# Patient Record
Sex: Female | Born: 1965 | Race: White | Hispanic: No | Marital: Single | Smoking: Never smoker
Health system: Southern US, Community
[De-identification: ages and names within clinical notes are randomized; demographics above are authoritative.]

## PROBLEM LIST (undated history)

## (undated) DIAGNOSIS — J302 Other seasonal allergic rhinitis: Secondary | ICD-10-CM

## (undated) DIAGNOSIS — M797 Fibromyalgia: Secondary | ICD-10-CM

## (undated) DIAGNOSIS — I10 Essential (primary) hypertension: Secondary | ICD-10-CM

## (undated) DIAGNOSIS — E119 Type 2 diabetes mellitus without complications: Secondary | ICD-10-CM

## (undated) HISTORY — PX: PALATE / UVULA BIOPSY / EXCISION: SUR128

## (undated) HISTORY — PX: CHOLECYSTECTOMY: SHX55

## (undated) HISTORY — DX: Other seasonal allergic rhinitis: J30.2

## (undated) HISTORY — PX: COLON SURGERY: SHX602

## (undated) HISTORY — PX: SINUS SURGERY WITH INSTATRAK: SHX5215

---

## 2009-06-04 ENCOUNTER — Encounter (INDEPENDENT_AMBULATORY_CARE_PROVIDER_SITE_OTHER): Payer: Self-pay | Admitting: Surgery

## 2009-06-04 ENCOUNTER — Inpatient Hospital Stay (HOSPITAL_COMMUNITY): Admission: RE | Admit: 2009-06-04 | Discharge: 2009-06-08 | Payer: Self-pay | Admitting: Surgery

## 2010-09-08 ENCOUNTER — Ambulatory Visit
Admission: RE | Admit: 2010-09-08 | Discharge: 2010-09-08 | Payer: Self-pay | Source: Home / Self Care | Admitting: Family Medicine

## 2010-10-10 NOTE — Assessment & Plan Note (Signed)
Summary: SINUS INF.,COUGH/WSE room 5   Vital Signs:  Patient Profile:   45 Years Old Female CC:      sinus problems Height:     64.5 inches Weight:      239.25 pounds O2 Sat:      98 % O2 treatment:    Room Air Temp:     99.0 degrees F oral Pulse rate:   90 / minute Resp:     18 per minute BP sitting:   131 / 84  (left arm) Cuff size:   large  Vitals Entered By: Clemens Catholic LPN (September 08, 2010 12:34 PM)                  Updated Prior Medication List: GILDESS FE 1.5/30 1.5-30 MG-MCG TABS (NORETHIN ACE-ETH ESTRAD-FE)  CEPHALEXIN 500 MG TABS (CEPHALEXIN)  ACTIFED COLD/ALLERGY 4-10 MG TABS (CHLORPHENIRAMINE-PHENYLEPHRINE)  PREDNISONE 10 MG TABS (PREDNISONE)   Current Allergies: ! PENICILLIN ! SEPTRAHistory of Present Illness Chief Complaint: sinus problems History of Present Illness:  Subjective: Patient complains of URI symptoms for 3 days.  She has a history of frequent sinusitis. + scratchy throat + cough started last night No pleuritic pain No wheezing + nasal congestion + post-nasal drainage + sinus pain/pressure No itchy/red eyes No earache No hemoptysis No SOB No fever/chills No nausea No vomiting No abdominal pain No diarrhea No skin rashes + fatigue No myalgias No headache Used OTC meds without relief   REVIEW OF SYSTEMS Constitutional Symptoms      Denies fever, chills, night sweats, weight loss, weight gain, and fatigue.  Eyes       Denies change in vision, eye pain, eye discharge, glasses, contact lenses, and eye surgery. Ear/Nose/Throat/Mouth       Complains of sinus problems.      Denies hearing loss/aids, change in hearing, ear pain, ear discharge, dizziness, frequent runny nose, frequent nose bleeds, sore throat, hoarseness, and tooth pain or bleeding.  Respiratory       Complains of dry cough.      Denies productive cough, wheezing, shortness of breath, asthma, bronchitis, and emphysema/COPD.  Cardiovascular       Denies  murmurs, chest pain, and tires easily with exhertion.    Gastrointestinal       Denies stomach pain, nausea/vomiting, diarrhea, constipation, blood in bowel movements, and indigestion. Genitourniary       Denies painful urination, kidney stones, and loss of urinary control. Neurological       Denies paralysis, seizures, and fainting/blackouts. Musculoskeletal       Denies muscle pain, joint pain, joint stiffness, decreased range of motion, redness, swelling, muscle weakness, and gout.  Skin       Denies bruising, unusual mles/lumps or sores, and hair/skin or nail changes.  Psych       Denies mood changes, temper/anger issues, anxiety/stress, speech problems, depression, and sleep problems. Other Comments: pt c/o sinus pressure, cough, watery eyes, green drainage x thursday. she started taking an old abt and prednisone rx that she had still no better.   Past History:  Past Medical History: fibromyalgia  Past Surgical History: sinus surgery 95 colon surgery 2010 Cholecystectomy  Family History: scleroderma, colon CA, heart dz, diabetes  Social History: Never Smoked Alcohol use-no Drug use-no Smoking Status:  never Drug Use:  no   Objective:  No acute distress  Eyes:  Pupils are equal, round, and reactive to light and accomdation.  Extraocular movement is intact.  Conjunctivae are not  inflamed.  Ears:  Canals normal.  Tympanic membranes normal but there is a serous effusion behind the left. Nose:  Normal septum.  Normal turbinates, mildly congested.   Mild maxillary sinus tenderness present.  Pharynx:  Normal  Neck:  Supple.  Slightly tender shotty posterior nodes are palpated bilaterally.  Lungs:  Clear to auscultation.  Breath sounds are equal.  Heart:  Regular rate and rhythm without murmurs, rubs, or gallops.  Abdomen:  Nontender without masses or hepatosplenomegaly.  Bowel sounds are present.  No CVA or flank tenderness.  Extremities:  No edema.   Skin:  No  rash Assessment New Problems: OTITIS MEDIA, SEROUS, ACUTE, LEFT (ICD-381.01) UPPER RESPIRATORY INFECTION, ACUTE (ICD-465.9)  SUSPECT VIRAL URI WITH EARLY SINUSITIS  Plan New Medications/Changes: BENZONATATE 200 MG CAPS (BENZONATATE) One by mouth hs as needed cough  #12 x 0, 09/08/2010, Donna Christen MD PREDNISONE 10 MG TABS (PREDNISONE) 2 PO BID for 2 days, then 1 BID for 2 days, then 1 daily for 2 days.  Take PC  #14 x 0, 09/08/2010, Donna Christen MD CLARITHROMYCIN 500 MG TABS (CLARITHROMYCIN) One Tab by mouth two times a day  #20 x 0, 09/08/2010, Donna Christen MD  New Orders: Pulse Oximetry (single measurment) [phone] New Patient Level III [99203] Planning Comments:   Begin Biaxin, tapering course of prednisone,  expectorant/decongestant, cough suppressant at bedtime.  Increase fluid intake Followup with PCP if not improving 10 to 14 days.   The patient and/or caregiver has been counseled thoroughly with regard to medications prescribed including dosage, schedule, interactions, rationale for use, and possible side effects and they verbalize understanding.  Diagnoses and expected course of recovery discussed and will return if not improved as expected or if the condition worsens. Patient and/or caregiver verbalized understanding.  Prescriptions: BENZONATATE 200 MG CAPS (BENZONATATE) One by mouth hs as needed cough  #12 x 0   Entered and Authorized by:   Donna Christen MD   Signed by:   Donna Christen MD on 09/08/2010   Method used:   Print then Give to Patient   RxID:   [id number] PREDNISONE 10 MG TABS (PREDNISONE) 2 PO BID for 2 days, then 1 BID for 2 days, then 1 daily for 2 days.  Take PC  #14 x 0   Entered and Authorized by:   Donna Christen MD   Signed by:   Donna Christen MD on 09/08/2010   Method used:   Print then Give to Patient   RxID:   [id number] CLARITHROMYCIN 500 MG TABS (CLARITHROMYCIN) One Tab by mouth two times a day  #20 x 0   Entered and Authorized by:    Donna Christen MD   Signed by:   Donna Christen MD on 09/08/2010   Method used:   Print then Give to Patient   RxID:   [id number]   Patient Instructions: 1)  Take Mucinex D (guaifenesin with decongestant) twice daily for congestion. 2)  Increase fluid intake, rest. 3)  May use Afrin nasal spray (or generic oxymetazoline) twice daily for about 5 days.  Also recommend using saline nasal spray several times daily and/or saline nasal irrigation. 4)  Followup with family doctor if not improving 10 to 14 days.  Orders Added: 1)  Pulse Oximetry (single measurment) [94760] 2)  New Patient Level III [40347]

## 2010-12-13 LAB — COMPREHENSIVE METABOLIC PANEL
Albumin: 3.7 g/dL (ref 3.5–5.2)
BUN: 9 mg/dL (ref 6–23)
Calcium: 9.5 mg/dL (ref 8.4–10.5)
Glucose, Bld: 109 mg/dL — ABNORMAL HIGH (ref 70–99)
Total Protein: 7.2 g/dL (ref 6.0–8.3)

## 2010-12-13 LAB — CBC
HCT: 31.9 % — ABNORMAL LOW (ref 36.0–46.0)
HCT: 32.9 % — ABNORMAL LOW (ref 36.0–46.0)
HCT: 41.1 % (ref 36.0–46.0)
Hemoglobin: 11.4 g/dL — ABNORMAL LOW (ref 12.0–15.0)
MCHC: 34.5 g/dL (ref 30.0–36.0)
MCV: 93.1 fL (ref 78.0–100.0)
MCV: 93.2 fL (ref 78.0–100.0)
Platelets: 302 10*3/uL (ref 150–400)
Platelets: 370 10*3/uL (ref 150–400)
RDW: 12.7 % (ref 11.5–15.5)
RDW: 13.3 % (ref 11.5–15.5)
RDW: 13.5 % (ref 11.5–15.5)
WBC: 9 10*3/uL (ref 4.0–10.5)

## 2010-12-13 LAB — PREGNANCY, URINE: Preg Test, Ur: NEGATIVE

## 2010-12-13 LAB — BASIC METABOLIC PANEL
BUN: 4 mg/dL — ABNORMAL LOW (ref 6–23)
CO2: 25 mEq/L (ref 19–32)
Chloride: 106 mEq/L (ref 96–112)
Creatinine, Ser: 0.79 mg/dL (ref 0.4–1.2)
GFR calc non Af Amer: >60 mL/min (ref >60)
GFR calc non Af Amer: >60 mL/min (ref >60)
Glucose, Bld: 103 mg/dL — ABNORMAL HIGH (ref 70–99)
Glucose, Bld: 166 mg/dL — ABNORMAL HIGH (ref 70–99)
Potassium: 4 mEq/L (ref 3.5–5.1)
Potassium: 4.7 mEq/L (ref 3.5–5.1)
Sodium: 137 mEq/L (ref 135–145)

## 2010-12-13 LAB — DIFFERENTIAL
Lymphocytes Relative: 18 % (ref 12–46)
Lymphs Abs: 1.6 10*3/uL (ref 0.7–4.0)
Monocytes Absolute: 0.7 10*3/uL (ref 0.1–1.0)
Monocytes Relative: 8 % (ref 3–12)
Neutro Abs: 6.5 10*3/uL (ref 1.7–7.7)
Neutrophils Relative %: 72 % (ref 43–77)

## 2010-12-13 LAB — CEA: CEA: <0.5 ng/mL (ref 0.0–5.0)

## 2011-10-28 ENCOUNTER — Encounter: Payer: Self-pay | Admitting: *Deleted

## 2011-10-28 ENCOUNTER — Emergency Department
Admission: EM | Admit: 2011-10-28 | Discharge: 2011-10-28 | Disposition: A | Discharge status: Home Health | Payer: BC Managed Care – PPO | Source: Home / Self Care | Attending: Emergency Medicine | Admitting: Emergency Medicine

## 2011-10-28 DIAGNOSIS — J069 Acute upper respiratory infection, unspecified: Secondary | ICD-10-CM

## 2011-10-28 DIAGNOSIS — R05 Cough: Secondary | ICD-10-CM

## 2011-10-28 HISTORY — DX: Fibromyalgia: M79.7

## 2011-10-28 LAB — POCT RAPID STREP A (OFFICE): Rapid Strep A Screen: NEGATIVE

## 2011-10-28 MED ORDER — CLARITHROMYCIN 500 MG PO TABS: 500.0000 mg | ORAL_TABLET | Freq: Two times a day (BID) | ORAL | Status: AC

## 2011-10-28 MED ORDER — GUAIFENESIN-CODEINE 100-10 MG/5ML PO SYRP: 5.0000 mL | ORAL_SOLUTION | Freq: Four times a day (QID) | ORAL | Status: AC | PRN

## 2011-10-28 NOTE — ED Notes (Signed)
Patient c/o sore throat, non-productive cough, and low grade fever x 3 days. She has not taken any OTC meds.

## 2011-10-28 NOTE — ED Provider Notes (Signed)
History     CSN: [id number]  Arrival date & time 10/28/11  1412   First MD Initiated Contact with Patient 10/28/11 1423      Chief Complaint  Patient presents with  . Sore Throat    (Consider location/radiation/quality/duration/timing/severity/associated sxs/prior treatment) HPI Carrie Baker is a 46 y.o. female who complains of onset of cold symptoms for 3 days.  + sore throat + cough No pleuritic pain No wheezing + nasal congestion + post-nasal drainage + sinus pain/pressure No chest congestion No itchy/red eyes No earache No hemoptysis No SOB + fever No nausea No vomiting No abdominal pain No diarrhea No skin rashes No fatigue No myalgias No headache    Past Medical History  Diagnosis Date  . Fibromyalgia     Past Surgical History  Procedure Date  . Cholecystectomy   . Sinus surgery with instatrak   . Palate / uvula biopsy / excision     Family History  Problem Relation Age of Onset  . Scleroderma Mother   . Diabetes Father   . Cancer Father     colon    History  Substance Use Topics  . Smoking status: Never Smoker   . Smokeless tobacco: Not on file  . Alcohol Use: No    OB History    Grav Para Term Preterm Abortions TAB SAB Ect Mult Living                  Review of Systems  All other systems reviewed and are negative.    Allergies  Penicillins and Sulfamethoxazole w/trimethoprim  Home Medications   Current Outpatient Rx  Name Route Sig Dispense Refill  . GILDESS 1.5/30 PO Oral Take by mouth.    Marland Kitchen CLARITHROMYCIN 500 MG PO TABS Oral Take 1 tablet (500 mg total) by mouth 2 (two) times daily. 16 tablet 0  . GUAIFENESIN-CODEINE 100-10 MG/5ML PO SYRP Oral Take 5 mLs by mouth 4 (four) times daily as needed for cough or congestion. 120 mL 0    BP 131/84  Pulse 99  Temp(Src) 99.9 F (37.7 C) (Oral)  Resp 16  Ht 5' 4.5" (1.638 m)  Wt 232 lb (105.235 kg)  BMI 39.21 kg/m2  SpO2 97%  Physical Exam  Nursing note and vitals  reviewed. Constitutional: She is oriented to person, place, and time. She appears well-developed and well-nourished.  HENT:  Head: Normocephalic and atraumatic.  Right Ear: Tympanic membrane, external ear and ear canal normal.  Left Ear: Tympanic membrane, external ear and ear canal normal.  Nose: Mucosal edema and rhinorrhea present.  Mouth/Throat: Posterior oropharyngeal erythema present. No oropharyngeal exudate or posterior oropharyngeal edema.  Eyes: No scleral icterus.  Neck: Neck supple.  Cardiovascular: Regular rhythm and normal heart sounds.   Pulmonary/Chest: Effort normal and breath sounds normal. No respiratory distress.  Neurological: She is alert and oriented to person, place, and time.  Skin: Skin is warm and dry.  Psychiatric: She has a normal mood and affect. Her speech is normal.    ED Course  Procedures (including critical care time)  Labs Reviewed - No data to display No results found.   1. Cough   2. Acute upper respiratory infections of unspecified site       MDM  1)  Take the prescribed antibiotic as instructed.  Hold for a few days since still likely viral.  Rapid strep negative. 2)  Use nasal saline solution (over the counter) at least 3 times a day. 3)  Use over  the counter decongestants like Zyrtec-D every 12 hours as needed to help with congestion.  If you have hypertension, do not take medicines with sudafed.  4)  Can take tylenol every 6 hours or motrin every 8 hours for pain or fever. 5)  Follow up with your primary doctor if no improvement in 5-7 days, sooner if increasing pain, fever, or new symptoms.     Lily Kocher, MD 10/28/11 [phone]

## 2011-10-31 ENCOUNTER — Emergency Department
Admit: 2011-10-31 | Discharge: 2011-10-31 | Disposition: A | Discharge status: Home / Self Care | Payer: BC Managed Care – PPO | Attending: Emergency Medicine | Admitting: Emergency Medicine

## 2011-10-31 ENCOUNTER — Emergency Department
Admission: EM | Admit: 2011-10-31 | Discharge: 2011-10-31 | Disposition: A | Discharge status: Home / Self Care | Payer: BC Managed Care – PPO | Source: Home / Self Care | Attending: Emergency Medicine | Admitting: Emergency Medicine

## 2011-10-31 ENCOUNTER — Encounter: Payer: Self-pay | Admitting: Emergency Medicine

## 2011-10-31 DIAGNOSIS — R509 Fever, unspecified: Secondary | ICD-10-CM

## 2011-10-31 DIAGNOSIS — R062 Wheezing: Secondary | ICD-10-CM

## 2011-10-31 DIAGNOSIS — R05 Cough: Secondary | ICD-10-CM

## 2011-10-31 DIAGNOSIS — R059 Cough, unspecified: Secondary | ICD-10-CM

## 2011-10-31 MED ORDER — PREDNISONE (PAK) 10 MG PO TABS: 10.0000 mg | ORAL_TABLET | Freq: Every day | ORAL | Status: AC

## 2011-10-31 MED ORDER — HYDROCODONE-HOMATROPINE 5-1.5 MG/5ML PO SYRP: 5.0000 mL | ORAL_SOLUTION | Freq: Four times a day (QID) | ORAL | Status: AC | PRN

## 2011-10-31 MED ORDER — LEVOFLOXACIN 750 MG PO TABS: 750.0000 mg | ORAL_TABLET | Freq: Every day | ORAL | Status: AC

## 2011-10-31 NOTE — ED Provider Notes (Addendum)
History     CSN: [id number]  Arrival date & time 10/31/11  1211   First MD Initiated Contact with Patient 10/31/11 1230      Chief Complaint  Patient presents with  . Nasal Congestion  . Cough  . Fever    (Consider location/radiation/quality/duration/timing/severity/associated sxs/prior treatment) HPI Carrie Baker is a 46 y.o. female who complains of onset of cold symptoms for 7 days. She was here about 3 days ago and was diagnosed with an upper respiratory infection at that time. She was prescribed cough medicine with codeine cough syrup which she took and it helped a little bit. She did not take the Biaxin as prescribed. No sore throat + cough No pleuritic pain No wheezing + nasal congestion + post-nasal drainage +sinus pain/pressure + chest congestion No itchy/red eyes No earache No hemoptysis No SOB No chills/sweats + fever No nausea No vomiting No abdominal pain No diarrhea No skin rashes + fatigue No myalgias No headache    Past Medical History  Diagnosis Date  . Fibromyalgia     Past Surgical History  Procedure Date  . Cholecystectomy   . Sinus surgery with instatrak   . Palate / uvula biopsy / excision     Family History  Problem Relation Age of Onset  . Scleroderma Mother   . Diabetes Father   . Cancer Father     colon    History  Substance Use Topics  . Smoking status: Never Smoker   . Smokeless tobacco: Not on file  . Alcohol Use: No    OB History    Grav Para Term Preterm Abortions TAB SAB Ect Mult Living                  Review of Systems  All other systems reviewed and are negative.    Allergies  Penicillins and Sulfamethoxazole w/trimethoprim  Home Medications   Current Outpatient Rx  Name Route Sig Dispense Refill  . CLARITHROMYCIN 500 MG PO TABS Oral Take 1 tablet (500 mg total) by mouth 2 (two) times daily. 16 tablet 0  . GUAIFENESIN-CODEINE 100-10 MG/5ML PO SYRP Oral Take 5 mLs by mouth 4 (four) times daily as  needed for cough or congestion. 120 mL 0  . GILDESS 1.5/30 PO Oral Take by mouth.      BP 128/87  Pulse 101  Temp(Src) 100 F (37.8 C) (Oral)  Resp 18  Ht 5' 4.75" (1.645 m)  Wt 226 lb (102.513 kg)  BMI 37.90 kg/m2  SpO2 93%  Physical Exam  Nursing note and vitals reviewed. Constitutional: She is oriented to person, place, and time. She appears well-developed and well-nourished.  HENT:  Head: Normocephalic and atraumatic.  Right Ear: Tympanic membrane, external ear and ear canal normal.  Left Ear: Tympanic membrane, external ear and ear canal normal.  Nose: Mucosal edema and rhinorrhea present.  Mouth/Throat: Posterior oropharyngeal erythema present. No oropharyngeal exudate or posterior oropharyngeal edema.  Eyes: No scleral icterus.  Neck: Neck supple.  Cardiovascular: Regular rhythm and normal heart sounds.   Pulmonary/Chest: Effort normal. No respiratory distress. She has no decreased breath sounds. She has wheezes (scattered). She has rhonchi (Scattered).  Neurological: She is alert and oriented to person, place, and time.  Skin: Skin is warm and dry.  Psychiatric: She has a normal mood and affect. Her speech is normal.    ED Course  Procedures (including critical care time)  Labs Reviewed - No data to display Dg Chest 2 View  10/31/2011  *  RADIOLOGY REPORT*  Clinical Data: Cough and fever/congestion for 6 days.  Nonsmoker.  CHEST - 2 VIEW  Comparison: None.  Findings: Midline trachea.  Normal heart size and mediastinal contours.  Moderate left hemidiaphragm elevation. No pleural effusion or pneumothorax.  Right lung is clear.  There is mild volume loss just over the left hemidiaphragm.  IMPRESSION:  1.  Moderate left hemidiaphragm elevation with adjacent left basilar volume loss/atelectasis. 2. No acute cardiopulmonary disease.  Original Report Authenticated By: Consuello Bossier, M.D.     1. Cough   2. Fever       MDM   A chest x-ray is obtained and read by the  radiologist as above.   I'm going to treat her with Levaquin, Tussionex, and a few days of prednisone to decrease the wheezing symptoms. Since there is no active pneumonia, I believe that prednisone for a few days would be okay.  I have advised her that with her current bronchitis, she may cough for a few weeks but she should be feeling better hopefully within a few days.  Lily Kocher, MD 10/31/11 1322    Lily Kocher, MD 10/31/11 1323

## 2011-10-31 NOTE — ED Notes (Signed)
Seen 3 days ago for congestion; given Rx for ABX if S&S worsened, but has not taken it because uncertain if appropriate for Chest congestion rather than the anticipated sinus infection.

## 2011-11-07 ENCOUNTER — Telehealth: Payer: Self-pay | Admitting: *Deleted

## 2011-11-13 ENCOUNTER — Encounter: Payer: Self-pay | Admitting: *Deleted

## 2011-11-13 ENCOUNTER — Emergency Department
Admission: EM | Admit: 2011-11-13 | Discharge: 2011-11-13 | Disposition: A | Discharge status: Home Health | Payer: BC Managed Care – PPO | Source: Home / Self Care | Attending: Family Medicine | Admitting: Family Medicine

## 2011-11-13 DIAGNOSIS — R05 Cough: Secondary | ICD-10-CM

## 2011-11-13 LAB — POCT CBC W AUTO DIFF (K'VILLE URGENT CARE)

## 2011-11-13 MED ORDER — CLARITHROMYCIN 500 MG PO TABS: 500.0000 mg | ORAL_TABLET | Freq: Two times a day (BID) | ORAL | Status: AC

## 2011-11-13 MED ORDER — BENZONATATE 200 MG PO CAPS: 200.0000 mg | ORAL_CAPSULE | Freq: Every day | ORAL | Status: AC

## 2011-11-13 NOTE — Discharge Instructions (Signed)
Take plain Mucinex (guaifenesin) twice daily for cough and congestion.  Increase fluid intake, rest. Stop Levaquin and begin Biaxin. Recommend a Tdap when well.

## 2011-11-13 NOTE — ED Provider Notes (Addendum)
History     CSN: [id number]  Arrival date & time 11/13/11  1414   First MD Initiated Contact with Patient 11/13/11 1427      Chief Complaint  Patient presents with  . Cough     HPI Comments: Patient continues on Levaquin and reports that her cough is now worse.  The cough is worse at night, when she often wheezes.  Her cough is generally non-productive and she now frequently coughs until she gags or vomits.  No pleuritic pain or shortness of breath.  No fevers, chills, and sweats.  She reports that she has not had a tetanus shot since 2003  The history is provided by the patient.    Past Medical History  Diagnosis Date  . Fibromyalgia     Past Surgical History  Procedure Date  . Cholecystectomy   . Sinus surgery with instatrak   . Palate / uvula biopsy / excision     Family History  Problem Relation Age of Onset  . Scleroderma Mother   . Diabetes Father   . Cancer Father     colon    History  Substance Use Topics  . Smoking status: Never Smoker   . Smokeless tobacco: Not on file  . Alcohol Use: No    OB History    Grav Para Term Preterm Abortions TAB SAB Ect Mult Living                  Review of Systems No sore throat at present + cough No pleuritic pain + wheezing at night + nasal congestion, decreased at present + post-nasal drainage No sinus pain/pressure No itchy/red eyes No earache No hemoptysis No SOB No fever/ chills No nausea No vomiting with cough No abdominal pain No diarrhea No urinary symptoms No skin rashes + fatigue No myalgias No headache   Allergies  Demerol; Penicillins; and Sulfamethoxazole w/trimethoprim  Home Medications   Current Outpatient Rx  Name Route Sig Dispense Refill  . LEVOFLOXACIN 750 MG PO TABS Oral Take 750 mg by mouth daily.    Marland Kitchen BENZONATATE 200 MG PO CAPS Oral Take 1 capsule (200 mg total) by mouth at bedtime. Take as needed for cough 12 capsule 0  . CLARITHROMYCIN 500 MG PO TABS Oral Take 1 tablet  (500 mg total) by mouth 2 (two) times daily. Take for one week 14 tablet 0  . GILDESS 1.5/30 PO Oral Take by mouth.      BP 132/83  Pulse 105  Temp(Src) 98.5 F (36.9 C) (Oral)  Resp 18  Ht 5' 4.5" (1.638 m)  Wt 228 lb 8 oz (103.647 kg)  BMI 38.62 kg/m2  SpO2 96%  Physical Exam Nursing notes and Vital Signs reviewed. Appearance:  Patient appears stated age, and in no acute distress.  Patient is obese (BMI 38.7) Eyes:  Pupils are equal, round, and reactive to light and accomodation.  Extraocular movement is intact.  Conjunctivae are not inflamed  Ears:  Canals normal.  Tympanic membranes normal.  Nose:  Mildly congested turbinates.  No sinus tenderness.    Pharynx:  Normal Neck:  Supple.  Slightly tender shotty posterior nodes are palpated bilaterally  Lungs:  Clear to auscultation.  Chest:  Distinct tenderness to palpation over the mid-sternum.  Heart:  Regular rate and rhythm without murmurs, rubs, or gallops.  Abdomen:  Nontender without masses or hepatosplenomegaly.  Bowel sounds are present.  No CVA or flank tenderness.  Extremities:  No edema.  No calf  tenderness Skin:  No rash present.   ED Course  Procedures  none   Labs Reviewed  POCT CBC W AUTO DIFF ([facility]) CBC:  WBC 10.2; LY 20.6; MO 6.7; GR 72.7; Hgb 13.9; Platelets 314   BORDETELLA PERTUSSIS ANTIBODY pending      1. Persistent dry cough, worse.  Suspect Pertussis Reviewed previous chest X-ray; note elevated left hemidiadiaphragm        MDM  Pertussis antibodies pending Take plain Mucinex (guaifenesin) twice daily for cough and congestion.  Increase fluid intake, rest. Stop Levaquin and begin Biaxin to cover possible Pertussis. Recommend a Tdap when well.  If cough persists one week, recommend follow-up with pulmonologist especially with finding of elevated left hemidiaphragm (age unknown).         Donna Christen, MD 11/13/11 1526  Addendum:  Patient reports that her present cough  suppressant is not helping.  Rx written for Tessalon at bedtime.  Use Mucinex daytime.  Donna Christen, MD 11/13/11 [phone]

## 2011-11-13 NOTE — ED Notes (Signed)
Pt states that she has had a cough x 3wks, she is being treated for pneumonia with levaquin 2 rounds and 6 days of prednisone. Denies fever.

## 2011-11-16 LAB — BORDETELLA PERTUSSIS ANTIBODY
B pertussis IgG Ab, Quant: 8.1 U/mL — ABNORMAL HIGH (ref <=2.4)
B pertussis IgM Ab, Quant: 1.1 U/mL (ref <=1.1)

## 2011-11-17 ENCOUNTER — Telehealth: Payer: Self-pay | Admitting: Emergency Medicine

## 2011-11-17 LAB — BORDETELLA PERTUSSIS, IGA BY IB

## 2011-11-18 ENCOUNTER — Telehealth: Payer: Self-pay | Admitting: *Deleted

## 2011-11-18 NOTE — ED Provider Notes (Signed)
Followup call to patient:  She reports that she continues to cough and wheeze occasionally, but cough has decreased somewhat and she no longer coughs until she gags. Plan:  Will continue Biaxin for 3 more days (10 days total).  Rx for #6 Begin tapering course of prednisone. Add Flovent HFA 38mcg/spray MDI, 2 puffs inhaled BID (Rx 10.6gm) Arrange follow-up by pulmonologist in about 10 days (history of elevated left hemidiaghram)  Lattie Haw, MD 11/18/11 1128

## 2011-12-01 ENCOUNTER — Institutional Professional Consult (permissible substitution): Payer: BC Managed Care – PPO | Admitting: Critical Care Medicine

## 2011-12-03 ENCOUNTER — Other Ambulatory Visit: Payer: Self-pay | Admitting: Family Medicine

## 2011-12-04 ENCOUNTER — Ambulatory Visit (HOSPITAL_BASED_OUTPATIENT_CLINIC_OR_DEPARTMENT_OTHER)
Admission: RE | Admit: 2011-12-04 | Discharge: 2011-12-04 | Disposition: A | Discharge status: Home / Self Care | Payer: BC Managed Care – PPO | Source: Ambulatory Visit | Attending: Critical Care Medicine | Admitting: Critical Care Medicine

## 2011-12-04 ENCOUNTER — Ambulatory Visit (INDEPENDENT_AMBULATORY_CARE_PROVIDER_SITE_OTHER): Payer: BC Managed Care – PPO | Admitting: Critical Care Medicine

## 2011-12-04 ENCOUNTER — Encounter: Payer: Self-pay | Admitting: Critical Care Medicine

## 2011-12-04 VITALS — BP 132/86 | HR 89 | Temp 98.4°F | Ht 64.5 in | Wt 231.0 lb

## 2011-12-04 DIAGNOSIS — IMO0001 Reserved for inherently not codable concepts without codable children: Secondary | ICD-10-CM

## 2011-12-04 DIAGNOSIS — R059 Cough, unspecified: Secondary | ICD-10-CM | POA: Insufficient documentation

## 2011-12-04 DIAGNOSIS — M797 Fibromyalgia: Secondary | ICD-10-CM | POA: Insufficient documentation

## 2011-12-04 DIAGNOSIS — J986 Disorders of diaphragm: Secondary | ICD-10-CM

## 2011-12-04 DIAGNOSIS — R05 Cough: Secondary | ICD-10-CM | POA: Insufficient documentation

## 2011-12-04 DIAGNOSIS — J9819 Other pulmonary collapse: Secondary | ICD-10-CM

## 2011-12-04 MED ORDER — PREDNISONE 10 MG PO TABS: ORAL_TABLET | ORAL | Status: DC

## 2011-12-04 MED ORDER — OMEPRAZOLE 20 MG PO CPDR: 20.0000 mg | DELAYED_RELEASE_CAPSULE | Freq: Every day | ORAL | Status: DC

## 2011-12-04 MED ORDER — TRAMADOL HCL 50 MG PO TABS: ORAL_TABLET | ORAL | Status: DC

## 2011-12-04 MED ORDER — CHLORPHENIRAMINE MALEATE ER 12 MG PO TBCR: EXTENDED_RELEASE_TABLET | ORAL | Status: DC

## 2011-12-04 MED ORDER — IOHEXOL 300 MG/ML  SOLN
80.0000 mL | Freq: Once | INTRAMUSCULAR | Status: AC | PRN
  Administered 2011-12-04: 80 mL via INTRAVENOUS

## 2011-12-04 MED ORDER — BENZONATATE 100 MG PO CAPS: ORAL_CAPSULE | ORAL | Status: AC

## 2011-12-04 NOTE — Patient Instructions (Addendum)
A CT Chest will be obtained A Sniff test to test diaphragm movement will be obtained Stop flovent Use cough protocol  With tramadol/tessalon Start Prednisone 10mg  Take 4 for two days three for two days two for two days one for two days Start chlorpheniramine 12mg  at bedtime Take omeprazole one daily for 30days only Follow reflux diet Return 1 month

## 2011-12-04 NOTE — Progress Notes (Signed)
Subjective:    Patient ID: Carrie Baker, female    DOB: 23-Sep-1965, 46 y.o.   MRN: [id number]  HPI Comments: Was seen in urgent care first week 11/2011.  Dx whooping cough.  Rx biaxin and pred pulse, inhaler and cough.  Pt was better but cough persists and is wheezing.   NOt as much dyspnea Pt first ill 10/26/11>>rx levaquin.  ?PNA.  Blood test pos.     Cough This is a new problem. The current episode started more than 1 month ago. The problem has been gradually improving. The problem occurs hourly. The cough is non-productive. Associated symptoms include wheezing. Pertinent negatives include no chest pain, chills, ear congestion, ear pain, fever, headaches, heartburn, hemoptysis, myalgias, nasal congestion, postnasal drip, rash, rhinorrhea, sore throat, shortness of breath, sweats or weight loss. The symptoms are aggravated by fumes and lying down. She has tried steroid inhaler for the symptoms. The treatment provided mild relief. Her past medical history is significant for environmental allergies. There is no history of asthma, bronchiectasis, bronchitis, COPD, emphysema or pneumonia. allergy testing pos: [place] trees      Review of Systems  Constitutional: Negative for fever, chills, weight loss, diaphoresis, activity change, appetite change, fatigue and unexpected weight change.  HENT: Positive for voice change. Negative for hearing loss, ear pain, nosebleeds, congestion, sore throat, facial swelling, rhinorrhea, sneezing, mouth sores, trouble swallowing, neck stiffness, dental problem, postnasal drip, sinus pressure, tinnitus and ear discharge.   Eyes: Negative for photophobia, discharge, itching and visual disturbance.  Respiratory: Positive for cough and wheezing. Negative for apnea, hemoptysis, choking, chest tightness, shortness of breath and stridor.   Cardiovascular: Negative for chest pain and palpitations.  Gastrointestinal: Positive for nausea. Negative for heartburn,  constipation, blood in stool and abdominal distention.  Genitourinary: Negative for dysuria, urgency, frequency, hematuria, flank pain, decreased urine volume and difficulty urinating.  Musculoskeletal: Negative for myalgias, back pain, joint swelling, arthralgias and gait problem.  Skin: Negative for color change, pallor and rash.  Neurological: Positive for numbness. Negative for dizziness, tremors, seizures, syncope, speech difficulty, weakness, light-headedness and headaches.  Hematological: Positive for environmental allergies. Negative for adenopathy. Does not bruise/bleed easily.  Psychiatric/Behavioral: Negative for confusion, sleep disturbance and agitation. The patient is not nervous/anxious.        Objective:   Physical Exam  Filed Vitals:   12/04/11 1338  BP: 132/86  Pulse: 89  Temp: 98.4 F (36.9 C)  TempSrc: Oral  Height: 5' 4.5" (1.638 m)  Weight: 231 lb (104.781 kg)  SpO2: 98%    Gen: Pleasant, well-nourished, in no distress,  normal affect  ENT: No lesions,  mouth clear,  oropharynx clear, no postnasal drip  Neck: No JVD, no TMG, no carotid bruits  Lungs: No use of accessory muscles, no dullness to percussion, prominent pseudo-wheeze  Cardiovascular: RRR, heart sounds normal, no murmur or gallops, no peripheral edema  Abdomen: soft and NT, no HSM,  BS normal  Musculoskeletal: No deformities, no cyanosis or clubbing  Neuro: alert, non focal  Skin: Warm, no lesions or rashes  Ct Chest W Contrast  12/04/2011  *RADIOLOGY REPORT*  Clinical Data: Elevated left hemidiaphragm.  Cough.  CT CHEST WITH CONTRAST  Technique:  Multidetector CT imaging of the chest was performed following the standard protocol during bolus administration of intravenous contrast.  Contrast: 80mL OMNIPAQUE IOHEXOL 300 MG/ML IJ SOLN  Comparison: 10/31/2011.  Findings: Elevated left hemidiaphragm with associated atelectasis. The appearance is suggestive of diaphragmatic paralysis/phrenic  nerve palsy.  No diaphragmatic hernia is identified.  The heart appears within normal limits.  The aorta and branch vessels appear normal.  Incidental imaging of the upper abdomen is within normal limits.  There is no axillary adenopathy.  No mediastinal or hilar adenopathy is present.  Central airways are patent.  No airspace disease or effusion.  Mildly exaggerated thoracic kyphosis. Minimal thoracic spondylosis.  No destructive osseous lesions.  IMPRESSION: Elevated left hemidiaphragm with associated left lower lobe atelectasis.  No hilar or mediastinal mass.  The lungs appear clear.  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  Original Report Authenticated By: Andreas Newport, M.D.         Assessment & Plan:   Cough Cyclical cough do to upper airway irritability and reflux disease. The patient has an elevated left hemidiaphragm and may have pathology related to this syndrome. It does not appear to be specific asthma in this case. Plan A CT Chest will be obtained>>> see results above A Sniff test to test diaphragm movement will be obtained Stop flovent Use cough protocol  With tramadol/tessalon Start Prednisone 10mg  Take 4 for two days three for two days two for two days one for two days Start chlorpheniramine 12mg  at bedtime Take omeprazole one daily for 30days only Follow reflux diet Return 1 month    Note CT scan has been obtained already and reveals left hemidiaphragm elevation without be spinal pathology. Will await results of fluoroscopic sniff test to determine whether there is diaphragm paresis Updated Medication List Outpatient Encounter Prescriptions as of 12/04/2011  Medication Sig Dispense Refill  . Ibuprofen (ADVIL PO) Take by mouth as needed.      Colleen Can FE 1/20 1-20 MG-MCG tablet Take 1 tablet by mouth Daily.      Marland Kitchen DISCONTD: FLOVENT HFA 44 MCG/ACT inhaler 2 puffs Twice daily.      . benzonatate (TESSALON) 100 MG capsule Take as directed 1-2  every 4 hours per cough protocol  90 capsule  4  . Chlorpheniramine Maleate 12 MG TBCR One at bedtime  30 each  0  . omeprazole (PRILOSEC) 20 MG capsule Take 1 capsule (20 mg total) by mouth daily.  30 capsule  0  . predniSONE (DELTASONE) 10 MG tablet Take 4 for two days three for two days two for two days one for two days  20 tablet  0  . traMADol (ULTRAM) 50 MG tablet Take per cyclic cough protocol  1-2 every 4-6 hours as needed  30 tablet  0  . DISCONTD: levofloxacin (LEVAQUIN) 750 MG tablet Take 750 mg by mouth daily.      Marland Kitchen DISCONTD: Norethindrone Acet-Ethinyl Est (GILDESS 1.5/30 PO) Take by mouth.       No facility-administered encounter medications on file as of 12/04/2011.

## 2011-12-04 NOTE — Assessment & Plan Note (Signed)
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 The patient has an elevated left hemidiaphragm and may have pathology related to this syndrome. It does not appear to be specific asthma in this case. Plan A CT Chest will be obtained A Sniff test to test diaphragm movement will be obtained Stop flovent Use cough protocol  With tramadol/tessalon Start Prednisone 10mg  Take 4 for two days three for two days two for two days one for two days Start chlorpheniramine 12mg  at bedtime Take omeprazole one daily for 30days only Follow reflux diet Return 1 month

## 2011-12-05 ENCOUNTER — Other Ambulatory Visit: Payer: Self-pay | Admitting: Critical Care Medicine

## 2011-12-05 MED ORDER — PREDNISONE 10 MG PO TABS: ORAL_TABLET | ORAL | Status: DC

## 2011-12-05 MED ORDER — OMEPRAZOLE 20 MG PO CPDR: 20.0000 mg | DELAYED_RELEASE_CAPSULE | Freq: Every day | ORAL | Status: DC

## 2011-12-05 MED ORDER — CHLORPHENIRAMINE MALEATE ER 12 MG PO TBCR: EXTENDED_RELEASE_TABLET | ORAL | Status: DC

## 2011-12-05 MED ORDER — TRAMADOL HCL 50 MG PO TABS: ORAL_TABLET | ORAL | Status: AC

## 2011-12-08 ENCOUNTER — Other Ambulatory Visit (HOSPITAL_COMMUNITY): Payer: BC Managed Care – PPO

## 2011-12-10 ENCOUNTER — Ambulatory Visit (HOSPITAL_COMMUNITY)
Admission: RE | Admit: 2011-12-10 | Discharge: 2011-12-10 | Disposition: A | Discharge status: Home / Self Care | Payer: BC Managed Care – PPO | Source: Ambulatory Visit | Attending: Critical Care Medicine | Admitting: Critical Care Medicine

## 2011-12-10 DIAGNOSIS — J986 Disorders of diaphragm: Secondary | ICD-10-CM | POA: Insufficient documentation

## 2012-01-08 ENCOUNTER — Ambulatory Visit (INDEPENDENT_AMBULATORY_CARE_PROVIDER_SITE_OTHER): Payer: BC Managed Care – PPO | Admitting: Critical Care Medicine

## 2012-01-08 ENCOUNTER — Encounter: Payer: Self-pay | Admitting: Critical Care Medicine

## 2012-01-08 VITALS — BP 124/82 | HR 91 | Temp 98.4°F | Ht 65.0 in | Wt 231.0 lb

## 2012-01-08 DIAGNOSIS — R05 Cough: Secondary | ICD-10-CM

## 2012-01-08 MED ORDER — BENZONATATE 100 MG PO CAPS: ORAL_CAPSULE | ORAL | Status: DC

## 2012-01-08 MED ORDER — CHLORPHENIRAMINE MALEATE ER 12 MG PO TBCR: EXTENDED_RELEASE_TABLET | ORAL | Status: DC

## 2012-01-08 MED ORDER — OMEPRAZOLE 20 MG PO CPDR: 20.0000 mg | DELAYED_RELEASE_CAPSULE | Freq: Every day | ORAL | Status: DC

## 2012-01-08 MED ORDER — TRAMADOL HCL 50 MG PO TABS: ORAL_TABLET | ORAL | Status: AC

## 2012-01-08 NOTE — Patient Instructions (Signed)
REsume cough protocol with tramadol and tessalon Resume prilosec one daily for two months Reflux diet  Resume chlorpheniramine 12mg  daily Keep sugar free lozenge in mouth as much as possible Return 4 months or as needed

## 2012-01-08 NOTE — Assessment & Plan Note (Signed)
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 The patient did well until stopping the chlorpheniramine and Prilosec. The patient now has recurrent cyclical cough. Also the patient did not stay on the sugar-free lozenge, and reflux diet Plan Continue reflux diet Resume Prilosec daily for 2 months Resume chlorpheniramine 12 mg each bedtime Resume cyclic cough protocol for a 2 day interval with tramadol and Tessalon Perles No indication for prednisone at this time Return 4 months worsen or if necessary

## 2012-01-08 NOTE — Progress Notes (Signed)
Subjective:    Patient ID: Carrie Baker, female    DOB: March 28, 1966, 46 y.o.   MRN: [id number]  HPI 01/08/2012  Pt did well on the cyclic cough program until past week.  Started coughing and wheezing more.  Cough protocol helped.  Is following reflux diet religiously.  Spirometry: was normal. Sniff test neg,  CT chest neg. Ran out of chlorpheniramine.  No postnasal drip, is sneezing.  Ran out of prilosec 7days. No ongoing sore throat.  Still hoarse.  Non productive cough.  Does hear wheezing.  The patient notes she was doing well with cough control until she stopped the maintenance program of Prilosec and chlorpheniramine. Also the patient has not continued using the sugar-free  Lozenges.   Past Medical History  Diagnosis Date  . Fibromyalgia   . Seasonal allergies      Family History  Problem Relation Age of Onset  . Scleroderma Mother   . Diabetes Father   . Cancer Father     colon  . Leukemia Brother     half brother  . Emphysema Paternal Aunt   . Emphysema Paternal Grandfather   . Emphysema Maternal Grandfather   . Heart disease Maternal Grandfather   . Colon cancer Father   . Melanoma Paternal Aunt      History   Social History  . Marital Status: Single    Spouse Name: N/A    Number of Children: 0  . Years of Education: N/A   Occupational History  . Sr. Publishing copy    Social History Main Topics  . Smoking status: Never Smoker   . Smokeless tobacco: Never Used  . Alcohol Use: No  . Drug Use: No  . Sexually Active: Not on file   Other Topics Concern  . Not on file   Social History Narrative  . No narrative on file     Allergies  Allergen Reactions  . Demerol   . Penicillins   . Sulfamethoxazole W-Trimethoprim      Outpatient Prescriptions Prior to Visit  Medication Sig Dispense Refill  . Ibuprofen (ADVIL PO) Take by mouth as needed.      . Chlorpheniramine Maleate 12 MG TBCR One at bedtime  30 each  0  . omeprazole (PRILOSEC) 20  MG capsule Take 1 capsule (20 mg total) by mouth daily.  30 capsule  0  . JUNEL FE 1/20 1-20 MG-MCG tablet Take 1 tablet by mouth Daily.      . predniSONE (DELTASONE) 10 MG tablet Take 4 for two days three for two days two for two days one for two days  20 tablet  0      Review of Systems  Constitutional: Negative for diaphoresis, activity change, appetite change, fatigue and unexpected weight change.  HENT: Positive for voice change. Negative for hearing loss, nosebleeds, congestion, facial swelling, sneezing, mouth sores, trouble swallowing, neck stiffness, dental problem, sinus pressure, tinnitus and ear discharge.   Eyes: Negative for photophobia, discharge, itching and visual disturbance.  Respiratory: Negative for apnea, choking, chest tightness and stridor.   Cardiovascular: Negative for palpitations.  Gastrointestinal: Positive for nausea. Negative for constipation, blood in stool and abdominal distention.  Genitourinary: Negative for dysuria, urgency, frequency, hematuria, flank pain, decreased urine volume and difficulty urinating.  Musculoskeletal: Negative for back pain, joint swelling, arthralgias and gait problem.  Skin: Negative for color change and pallor.  Neurological: Positive for numbness. Negative for dizziness, tremors, seizures, syncope, speech difficulty, weakness and light-headedness.  Hematological:  Negative for adenopathy. Does not bruise/bleed easily.  Psychiatric/Behavioral: Negative for confusion, sleep disturbance and agitation. The patient is not nervous/anxious.        Objective:   Physical Exam   Filed Vitals:   01/08/12 0857  BP: 124/82  Pulse: 91  Temp: 98.4 F (36.9 C)  TempSrc: Oral  Height: 5\' 5"  (1.651 m)  Weight: 231 lb (104.781 kg)  SpO2: 97%    Gen: Pleasant, well-nourished, in no distress,  normal affect  ENT: No lesions,  mouth clear,  oropharynx clear, no postnasal drip  Neck: No JVD, no TMG, no carotid bruits  Lungs: No use  of accessory muscles, no dullness to percussion, prominent pseudo-wheeze  Cardiovascular: RRR, heart sounds normal, no murmur or gallops, no peripheral edema  Abdomen: soft and NT, no HSM,  BS normal  Musculoskeletal: No deformities, no cyanosis or clubbing  Neuro: alert, non focal  Skin: Warm, no lesions or rashes         Assessment & Plan:   Cough Cyclical cough do to upper airway irritability and reflux disease. The patient did well until stopping the chlorpheniramine and Prilosec. The patient now has recurrent cyclical cough. Also the patient did not stay on the sugar-free lozenge, and reflux diet Plan Continue reflux diet Resume Prilosec daily for 2 months Resume chlorpheniramine 12 mg each bedtime Resume cyclic cough protocol for a 2 day interval with tramadol and Tessalon Perles No indication for prednisone at this time Return 4 months worsen or if necessary      Updated Medication List Outpatient Encounter Prescriptions as of 01/08/2012  Medication Sig Dispense Refill  . Chlorpheniramine Maleate 12 MG TBCR One at bedtime  30 each  6  . Ibuprofen (ADVIL PO) Take by mouth as needed.      . norethindrone-ethinyl estradiol (GILDESS FE 1/20) 1-20 MG-MCG tablet Take 1 tablet by mouth daily.      Marland Kitchen omeprazole (PRILOSEC) 20 MG capsule Take 1 capsule (20 mg total) by mouth daily.  30 capsule  2  . DISCONTD: Chlorpheniramine Maleate 12 MG TBCR One at bedtime  30 each  0  . DISCONTD: omeprazole (PRILOSEC) 20 MG capsule Take 1 capsule (20 mg total) by mouth daily.  30 capsule  0  . benzonatate (TESSALON) 100 MG capsule Take 1-2 every 4 hours as needed for cough  30 capsule  6  . traMADol (ULTRAM) 50 MG tablet Take per cyclic cough protocol  1-2 every 4-6 hours as needed  30 tablet  0  . DISCONTD: benzonatate (TESSALON) 100 MG capsule Take 100 mg by mouth.       . DISCONTDColleen Can FE 1/20 1-20 MG-MCG tablet Take 1 tablet by mouth Daily.      Marland Kitchen DISCONTD: predniSONE (DELTASONE) 10  MG tablet Take 4 for two days three for two days two for two days one for two days  20 tablet  0        Updated Medication List Outpatient Encounter Prescriptions as of 01/08/2012  Medication Sig Dispense Refill  . Chlorpheniramine Maleate 12 MG TBCR One at bedtime  30 each  0  . Ibuprofen (ADVIL PO) Take by mouth as needed.      . norethindrone-ethinyl estradiol (GILDESS FE 1/20) 1-20 MG-MCG tablet Take 1 tablet by mouth daily.      Marland Kitchen omeprazole (PRILOSEC) 20 MG capsule Take 1 capsule (20 mg total) by mouth daily.  30 capsule  0  . benzonatate (TESSALON) 100 MG capsule Take 100  mg by mouth.       . predniSONE (DELTASONE) 10 MG tablet Take 4 for two days three for two days two for two days one for two days  20 tablet  0  . DISCONTD: JUNEL FE 1/20 1-20 MG-MCG tablet Take 1 tablet by mouth Daily.

## 2012-05-13 ENCOUNTER — Other Ambulatory Visit: Payer: Self-pay | Admitting: Critical Care Medicine

## 2012-06-10 ENCOUNTER — Ambulatory Visit: Payer: BC Managed Care – PPO | Admitting: Critical Care Medicine

## 2012-07-01 ENCOUNTER — Ambulatory Visit (INDEPENDENT_AMBULATORY_CARE_PROVIDER_SITE_OTHER): Payer: BC Managed Care – PPO | Admitting: Critical Care Medicine

## 2012-07-01 ENCOUNTER — Encounter: Payer: Self-pay | Admitting: Critical Care Medicine

## 2012-07-01 VITALS — BP 122/78 | HR 83 | Temp 97.5°F | Ht 65.0 in | Wt 224.0 lb

## 2012-07-01 DIAGNOSIS — Z23 Encounter for immunization: Secondary | ICD-10-CM

## 2012-07-01 DIAGNOSIS — R05 Cough: Secondary | ICD-10-CM

## 2012-07-01 DIAGNOSIS — R059 Cough, unspecified: Secondary | ICD-10-CM

## 2012-07-01 MED ORDER — OMEPRAZOLE 20 MG PO CPDR: 20.0000 mg | DELAYED_RELEASE_CAPSULE | Freq: Every day | ORAL | Status: DC

## 2012-07-01 MED ORDER — CHLORPHENIRAMINE MALEATE ER 12 MG PO TBCR: EXTENDED_RELEASE_TABLET | ORAL | Status: DC

## 2012-07-01 MED ORDER — BENZONATATE 100 MG PO CAPS: ORAL_CAPSULE | ORAL | Status: DC

## 2012-07-01 NOTE — Progress Notes (Signed)
Subjective:    Patient ID: Carrie Baker, female    DOB: 11-03-1965, 46 y.o.   MRN: [id number]  HPI 07/01/2012 Since last OV, cough is better.  Occ will feel like has something in the throat.  Will gag if cough for long. This mostly will happen.  No pndrip.    Pt denies any significant sore throat, nasal congestion or excess secretions, fever, chills, sweats, unintended weight loss, pleurtic or exertional chest pain, orthopnea PND, or leg swelling Pt denies any increase in rescue therapy over baseline, denies waking up needing it or having any early am or nocturnal exacerbations of coughing/wheezing/or dyspnea. Pt also denies any obvious fluctuation in symptoms with  weather or environmental change or other alleviating or aggravating factors     Past Medical History  Diagnosis Date  . Fibromyalgia   . Seasonal allergies      Family History  Problem Relation Age of Onset  . Scleroderma Mother   . Diabetes Father   . Cancer Father     colon  . Leukemia Brother     half brother  . Emphysema Paternal Aunt   . Emphysema Paternal Grandfather   . Emphysema Maternal Grandfather   . Heart disease Maternal Grandfather   . Colon cancer Father   . Melanoma Paternal Aunt      History   Social History  . Marital Status: Single    Spouse Name: N/A    Number of Children: 0  . Years of Education: N/A   Occupational History  . Sr. Publishing copy    Social History Main Topics  . Smoking status: Never Smoker   . Smokeless tobacco: Never Used  . Alcohol Use: No  . Drug Use: No  . Sexually Active: Not on file   Other Topics Concern  . Not on file   Social History Narrative  . No narrative on file     Allergies  Allergen Reactions  . Demerol   . Penicillins   . Sulfamethoxazole W-Trimethoprim      Outpatient Prescriptions Prior to Visit  Medication Sig Dispense Refill  . Ibuprofen (ADVIL PO) Take by mouth as needed.      . norethindrone-ethinyl estradiol  (GILDESS FE 1/20) 1-20 MG-MCG tablet Take 1 tablet by mouth daily.      . benzonatate (TESSALON) 100 MG capsule Take 1-2 every 4 hours as needed for cough  30 capsule  6  . Chlorpheniramine Maleate 12 MG TBCR One at bedtime  30 each  6  . omeprazole (PRILOSEC) 20 MG capsule TAKE 1 CAPSULE (20 MG TOTAL) BY MOUTH DAILY.  30 capsule  0      Review of Systems  Constitutional: Negative for diaphoresis, activity change, appetite change, fatigue and unexpected weight change.  HENT: Positive for voice change. Negative for hearing loss, nosebleeds, congestion, facial swelling, sneezing, mouth sores, trouble swallowing, neck stiffness, dental problem, sinus pressure, tinnitus and ear discharge.   Eyes: Negative for photophobia, discharge, itching and visual disturbance.  Respiratory: Negative for apnea, choking, chest tightness and stridor.   Cardiovascular: Negative for palpitations.  Gastrointestinal: Positive for nausea. Negative for constipation, blood in stool and abdominal distention.  Genitourinary: Negative for dysuria, urgency, frequency, hematuria, flank pain, decreased urine volume and difficulty urinating.  Musculoskeletal: Negative for back pain, joint swelling, arthralgias and gait problem.  Skin: Negative for color change and pallor.  Neurological: Positive for numbness. Negative for dizziness, tremors, seizures, syncope, speech difficulty, weakness and light-headedness.  Hematological: Negative  for adenopathy. Does not bruise/bleed easily.  Psychiatric/Behavioral: Negative for confusion, disturbed wake/sleep cycle and agitation. The patient is not nervous/anxious.        Objective:   Physical Exam   Filed Vitals:   07/01/12 0859  BP: 122/78  Pulse: 83  Temp: 97.5 F (36.4 C)  TempSrc: Oral  Height: 5\' 5"  (1.651 m)  Weight: 224 lb (101.606 kg)  SpO2: 97%    Gen: Pleasant, well-nourished, in no distress,  normal affect  ENT: No lesions,  mouth clear,  oropharynx clear,  no postnasal drip  Neck: No JVD, no TMG, no carotid bruits  Lungs: No use of accessory muscles, no dullness to percussion, less prominent pseudo-wheeze  Cardiovascular: RRR, heart sounds normal, no murmur or gallops, no peripheral edema  Abdomen: soft and NT, no HSM,  BS normal  Musculoskeletal: No deformities, no cyanosis or clubbing  Neuro: alert, non focal  Skin: Warm, no lesions or rashes         Assessment & Plan:   Cyclic Cough due to post nasal drip and GERD Cyclical cough due to postnasal drip and GERD improved Plan Prn tessalon Cyclic cough protocol Cont PPI Cont QHS chlorpheniramine 12mg . Cont Reflux diet Rov 12 months and prn    Updated Medication List Outpatient Encounter Prescriptions as of 07/01/2012  Medication Sig Dispense Refill  . benzonatate (TESSALON) 100 MG capsule Take 1-2 every 4 hours as needed for cough  30 capsule  6  . Chlorpheniramine Maleate 12 MG TBCR One at bedtime  30 each  6  . Ibuprofen (ADVIL PO) Take by mouth as needed.      . norethindrone-ethinyl estradiol (GILDESS FE 1/20) 1-20 MG-MCG tablet Take 1 tablet by mouth daily.      Marland Kitchen omeprazole (PRILOSEC) 20 MG capsule Take 1 capsule (20 mg total) by mouth daily.  30 capsule  6  . DISCONTD: benzonatate (TESSALON) 100 MG capsule Take 1-2 every 4 hours as needed for cough  30 capsule  6  . DISCONTD: Chlorpheniramine Maleate 12 MG TBCR One at bedtime  30 each  6  . DISCONTD: omeprazole (PRILOSEC) 20 MG capsule TAKE 1 CAPSULE (20 MG TOTAL) BY MOUTH DAILY.  30 capsule  0        Updated Medication List Outpatient Encounter Prescriptions as of 07/01/2012  Medication Sig Dispense Refill  . benzonatate (TESSALON) 100 MG capsule Take 1-2 every 4 hours as needed for cough  30 capsule  6  . Chlorpheniramine Maleate 12 MG TBCR One at bedtime  30 each  6  . Ibuprofen (ADVIL PO) Take by mouth as needed.      . norethindrone-ethinyl estradiol (GILDESS FE 1/20) 1-20 MG-MCG tablet Take 1 tablet  by mouth daily.      Marland Kitchen omeprazole (PRILOSEC) 20 MG capsule Take 1 capsule (20 mg total) by mouth daily.  30 capsule  6  . DISCONTD: benzonatate (TESSALON) 100 MG capsule Take 1-2 every 4 hours as needed for cough  30 capsule  6  . DISCONTD: Chlorpheniramine Maleate 12 MG TBCR One at bedtime  30 each  6  . DISCONTD: omeprazole (PRILOSEC) 20 MG capsule TAKE 1 CAPSULE (20 MG TOTAL) BY MOUTH DAILY.  30 capsule  0

## 2012-07-01 NOTE — Patient Instructions (Signed)
All meds refilled Flu vaccine No change in medications Return 1 year or as needed

## 2012-07-01 NOTE — Assessment & Plan Note (Signed)
Cyclical cough due to postnasal drip and GERD improved Plan Prn tessalon Cyclic cough protocol Cont PPI Cont QHS chlorpheniramine 12mg . Cont Reflux diet Rov 12 months and prn

## 2012-08-14 ENCOUNTER — Emergency Department
Admission: EM | Admit: 2012-08-14 | Discharge: 2012-08-14 | Disposition: A | Discharge status: Home / Self Care | Payer: BC Managed Care – PPO | Source: Home / Self Care | Attending: Family Medicine | Admitting: Family Medicine

## 2012-08-14 DIAGNOSIS — M76899 Other specified enthesopathies of unspecified lower limb, excluding foot: Secondary | ICD-10-CM

## 2012-08-14 DIAGNOSIS — J069 Acute upper respiratory infection, unspecified: Secondary | ICD-10-CM

## 2012-08-14 DIAGNOSIS — M7062 Trochanteric bursitis, left hip: Secondary | ICD-10-CM

## 2012-08-14 MED ORDER — CEPHALEXIN 500 MG PO CAPS: 500.0000 mg | ORAL_CAPSULE | Freq: Four times a day (QID) | ORAL | Status: DC

## 2012-08-14 NOTE — ED Provider Notes (Signed)
History     CSN: [id number]  Arrival date & time 08/14/12  1033   First MD Initiated Contact with Patient 08/14/12 1049      Chief Complaint  Patient presents with  . Cough    couple days      HPI Comments: Nyelli has a cough, fever, chills and body aches for a couple of days. She was seen at [facility] 3 days prior and treated with Cephalexin. She is not getting better.  She also complains of left hip pain for quite some time.  She recalls no trauma to her hip.  The history is provided by the patient.    Past Medical History  Diagnosis Date  . Fibromyalgia   . Seasonal allergies     Past Surgical History  Procedure Date  . Cholecystectomy   . Sinus surgery with instatrak   . Palate / uvula biopsy / excision     Family History  Problem Relation Age of Onset  . Scleroderma Mother   . Diabetes Father   . Cancer Father     colon  . Leukemia Brother     half brother  . Emphysema Paternal Aunt   . Emphysema Paternal Grandfather   . Emphysema Maternal Grandfather   . Heart disease Maternal Grandfather   . Colon cancer Father   . Melanoma Paternal Aunt     History  Substance Use Topics  . Smoking status: Never Smoker   . Smokeless tobacco: Never Used  . Alcohol Use: No    OB History    Grav Para Term Preterm Abortions TAB SAB Ect Mult Living                  Review of Systems + sore throat + cough No pleuritic pain No wheezing + nasal congestion + post-nasal drainage ? sinus pain/pressure No itchy/red eyes No earache No hemoptysis No SOB + fever, + chills No nausea No vomiting No abdominal pain No diarrhea No urinary symptoms No skin rashes + fatigue + myalgias No headache Used OTC meds without relief  Allergies  Demerol; Penicillins; and Sulfamethoxazole w-trimethoprim  Home Medications   Current Outpatient Rx  Name  Route  Sig  Dispense  Refill  . CHLORPHENIRAMINE MALEATE ER 12 MG PO TBCR      One at bedtime   30 each   6    . BENZONATATE 100 MG PO CAPS      Take 1-2 every 4 hours as needed for cough   30 capsule   6   . CEPHALEXIN 500 MG PO CAPS   Oral   Take 1 capsule (500 mg total) by mouth 4 (four) times daily.   28 capsule   0   . ADVIL PO   Oral   Take by mouth as needed.         . MELOXICAM 15 MG PO TABS      One tab PO qAM with breakfast for 2 weeks, then daily prn pain.   30 tablet   3   . NORETHIN ACE-ETH ESTRAD-FE 1-20 MG-MCG PO TABS   Oral   Take 1 tablet by mouth daily.         Marland Kitchen OMEPRAZOLE 20 MG PO CPDR   Oral   Take 1 capsule (20 mg total) by mouth daily.   30 capsule   6     BP 124/82  Pulse 88  Temp 99 F (37.2 C) (Oral)  Resp 18  Ht 5'  5" (1.651 m)  Wt 220 lb (99.791 kg)  BMI 36.61 kg/m2  SpO2 95%  Physical Exam Nursing notes and Vital Signs reviewed. Appearance:  Patient appears stated age, and in no acute distress.  Patient is obese (BMI 36.6) Eyes:  Pupils are equal, round, and reactive to light and accomodation.  Extraocular movement is intact.  Conjunctivae are not inflamed  Ears:  Canals normal.  Tympanic membranes normal.  Nose:  Mildly congested turbinates.  No sinus tenderness.   Pharynx:  Normal Neck:  Supple.  Slightly tender shotty posterior nodes are palpated bilaterally  Lungs:  Clear to auscultation.  Breath sounds are equal.  Heart:  Regular rate and rhythm without murmurs, rubs, or gallops.  Abdomen:  Nontender without masses or hepatosplenomegaly.  Bowel sounds are present.  No CVA or flank tenderness.  Extremities:  No edema.  There is distinct tenderness over both greater trochanters, worse on the left.  Hips have full range of motion. Skin:  No rash present.   ED Course  Procedures none      1. Acute upper respiratory infections of unspecified site; suspect viral URI   2. Trochanteric bursitis of both hips       MDM  Will continue Keflex since patient has already started it. Take Mucinex D (guaifenesin with decongestant)  twice daily for congestion.  Increase fluid intake, rest. May use Afrin nasal spray (or generic oxymetazoline) twice daily for about 5 days.  Also recommend using saline nasal spray several times daily and saline nasal irrigation (AYR is a common brand) Stop all antihistamines for now, and other non-prescription cough/cold preparations. Continue Tessalon at bedtime for cough. Follow-up with family doctor if not improving 7 to 10 days. Followup with Dr. Rodney Langton for hip bursitis        Lattie Haw, MD 08/17/12 2104

## 2012-08-14 NOTE — ED Notes (Signed)
Carrie Baker has a cough, fever, chills and body aches for a couple of days. She was seen at [facility] on Wednesday and treated with Cephalexin. She is not getting better.

## 2012-08-17 ENCOUNTER — Ambulatory Visit (INDEPENDENT_AMBULATORY_CARE_PROVIDER_SITE_OTHER): Payer: BC Managed Care – PPO | Admitting: Sports Medicine

## 2012-08-17 ENCOUNTER — Encounter: Payer: Self-pay | Admitting: Sports Medicine

## 2012-08-17 ENCOUNTER — Telehealth: Payer: Self-pay | Admitting: *Deleted

## 2012-08-17 VITALS — BP 132/83 | HR 79 | Wt 219.0 lb

## 2012-08-17 DIAGNOSIS — M7062 Trochanteric bursitis, left hip: Secondary | ICD-10-CM | POA: Insufficient documentation

## 2012-08-17 DIAGNOSIS — M76899 Other specified enthesopathies of unspecified lower limb, excluding foot: Secondary | ICD-10-CM

## 2012-08-17 MED ORDER — MELOXICAM 15 MG PO TABS: ORAL_TABLET | ORAL | Status: DC

## 2012-08-17 NOTE — Progress Notes (Signed)
SPORTS MEDICINE CONSULTATION REPORT  Subjective:    I'm seeing this patient as a consultation for:  Dr. Cathren Harsh  CC: Hip pain  HPI: This is a very pleasant 46 year old female who's had a couple week history of pain that she localizes over her left greater trochanter. The pain is localized, and does not radiate. She has tried some oral over-the-counter anti-inflammatories which have been ineffective. It makes it difficult for her to sleep on her left side. She was seen in urgent care, it was recommended that she come see me for further evaluation and treatment. The pain is severe.  Past medical history, Surgical history, Family history, Social history, Allergies, and medications have been entered into the medical record, reviewed, and no changes needed.   Review of Systems: No headache, visual changes, nausea, vomiting, diarrhea, constipation, dizziness, abdominal pain, skin rash, fevers, chills, night sweats, weight loss, swollen lymph nodes, body aches, joint swelling, muscle aches, chest pain, shortness of breath, mood changes, visual or auditory hallucinations.   Objective:   Vitals:  Afebrile, vital signs stable. General: Well Developed, well nourished, and in no acute distress.  Neuro/Psych: Alert and oriented x3, extra-ocular muscles intact, able to move all 4 extremities.  Skin: Warm and dry, no rashes noted.  Respiratory: Not using accessory muscles, speaking in full sentences, trachea midline.  Cardiovascular: Pulses palpable, no extremity edema. Abdomen: Does not appear distended. Left Hip: ROM IR: 45 Deg, ER: 45 Deg, Flexion: 120 Deg, Extension: 100 Deg, Abduction: 45 Deg, Adduction: 45 Deg Strength IR: 5/5, ER: 5/5, Flexion: 5/5, Extension: 5/5, Abduction: 4/5, Adduction: 5/5 Pelvic alignment unremarkable to inspection and palpation. There is significant tenderness to palpation over the left greater trochanter. Hip abductor strength is markedly weak on the left side when  compared to the right. No tenderness over piriformis and greater trochanter. No pain with FABER or FADIR. No SI joint tenderness and normal minimal SI movement. Leg lengths were equal.  Procedure:  Injection of left trochanteric bursa Consent obtained and verified. Time-out conducted. Noted no overlying erythema, induration, or other signs of local infection. Skin prepped in a sterile fashion. Topical analgesic spray: Ethyl chloride. Completed without difficulty. Meds: Spinal needle guided down to greater trochanter, and bounced off the bone. At that point, 1 cc Kenalog 40, 4 cc lidocaine injected in a fanlike pattern. Pain immediately improved suggesting accurate placement of the medication. Advised to call if fevers/chills, erythema, induration, drainage, or persistent bleeding.  Impression and Recommendations:   This case required medical decision making of moderate complexity.

## 2012-08-17 NOTE — Patient Instructions (Addendum)
Hip Rehabilitation Protocol:  1.  Side leg raises.  3x30 with no weight, then 3x15 with 2 lb ankle weight, then 3x15 with 5 lb ankle weight 2.  Standing hip rotation.  3x30 with no weight, then 3x15 with 2 lb ankle weight, then 3x15 with 5 lb ankle weight. 3.  Side step ups.  3x30 with no weight, then 3x15 with 5 lbs in backpack, then 3x15 with 10 lbs in backpack. 

## 2012-08-17 NOTE — Assessment & Plan Note (Signed)
Injection as above. Home redilatation exercises. Mobic. Return to clinic in 4 weeks.

## 2012-09-14 ENCOUNTER — Ambulatory Visit (INDEPENDENT_AMBULATORY_CARE_PROVIDER_SITE_OTHER): Payer: BC Managed Care – PPO

## 2012-09-14 ENCOUNTER — Encounter: Payer: Self-pay | Admitting: Sports Medicine

## 2012-09-14 ENCOUNTER — Ambulatory Visit (INDEPENDENT_AMBULATORY_CARE_PROVIDER_SITE_OTHER): Payer: BC Managed Care – PPO | Admitting: Sports Medicine

## 2012-09-14 VITALS — BP 115/76 | HR 72 | Wt 218.0 lb

## 2012-09-14 DIAGNOSIS — M25569 Pain in unspecified knee: Secondary | ICD-10-CM

## 2012-09-14 DIAGNOSIS — M2240 Chondromalacia patellae, unspecified knee: Secondary | ICD-10-CM | POA: Insufficient documentation

## 2012-09-14 DIAGNOSIS — M898X9 Other specified disorders of bone, unspecified site: Secondary | ICD-10-CM

## 2012-09-14 DIAGNOSIS — M7062 Trochanteric bursitis, left hip: Secondary | ICD-10-CM

## 2012-09-14 NOTE — Assessment & Plan Note (Signed)
Left trochanteric bursitis resolved with a single injection. She has been working extensively on the rehabilitation exercises and her strength to abduction is improved significantly. Followup as needed for this.

## 2012-09-14 NOTE — Assessment & Plan Note (Signed)
Bilateral. We will start conservatively, home rehabilitation, continue Mobic, McConnell taping. Avoid deep knee bends past 90. X-rays. She will come back in 4 weeks if no better we will consider injection.

## 2012-09-14 NOTE — Progress Notes (Signed)
SPORTS MEDICINE CONSULTATION REPORT  Subjective:    CC: Followup  HPI: Left trochanteric bursitis: Resolved after home rehabilitation exercises as well as a single trochanter bursa injection. She's only using Mobic occasionally.  Bilateral knee pain: Anterior, present for decades, worse when going up stairs, squatting, bending knees. She does get considerable grinding, but denies swelling, or other mechanical symptoms. Pain does not radiate, it is mild.  Past medical history, Surgical history, Family history, Social history, Allergies, and medications have been entered into the medical record, reviewed, and no changes needed.   Review of Systems: No headache, visual changes, nausea, vomiting, diarrhea, constipation, dizziness, abdominal pain, skin rash, fevers, chills, night sweats, weight loss, swollen lymph nodes, body aches, joint swelling, muscle aches, chest pain, shortness of breath, mood changes, visual or auditory hallucinations.   Objective:   Vitals:  Afebrile, vital signs stable. General: Well Developed, well nourished, and in no acute distress.  Neuro/Psych: Alert and oriented x3, extra-ocular muscles intact, able to move all 4 extremities.  Skin: Warm and dry, no rashes noted.  Respiratory: Not using accessory muscles, speaking in full sentences, trachea midline.  Cardiovascular: Pulses palpable, no extremity edema. Abdomen: Does not appear distended. Bilateral Knee: Normal to inspection with no erythema or effusion or obvious bony abnormalities. Palpation normal with no warmth, joint line tenderness, patellar tenderness, or condyle tenderness. ROM full in flexion and extension and lower leg rotation. Ligaments with solid consistent endpoints including ACL, PCL, LCL, MCL. Negative Mcmurray's, Apley's, and Thessalonian tests. Non painful patellar compression. Patellar glide with significant crepitus. Poor vastus medialis definition Patellar and quadriceps tendons  unremarkable. Hamstring and quadriceps strength is normal.  Hip abductor strength is greatly improved.  Impression and Recommendations:   This case required medical decision making of moderate complexity.

## 2012-09-14 NOTE — Patient Instructions (Addendum)
Avoid deep knee bending past 90. Home rehabilitation. McConnell taping.

## 2012-10-05 ENCOUNTER — Other Ambulatory Visit: Payer: Self-pay | Admitting: Critical Care Medicine

## 2012-10-05 NOTE — Telephone Encounter (Signed)
clorphenermine rx was sent on 07/01/12 # 30 x 6.  Called [facility], spoke with [place].  Was advised they did receive rx from 07/01/12.  [facility] rx was sent in error -- pls disregard per Gregary Signs.

## 2012-10-13 ENCOUNTER — Ambulatory Visit (INDEPENDENT_AMBULATORY_CARE_PROVIDER_SITE_OTHER): Payer: BC Managed Care – PPO | Admitting: Sports Medicine

## 2012-10-13 DIAGNOSIS — M2240 Chondromalacia patellae, unspecified knee: Secondary | ICD-10-CM

## 2012-10-13 DIAGNOSIS — M7062 Trochanteric bursitis, left hip: Secondary | ICD-10-CM

## 2012-10-13 DIAGNOSIS — M76899 Other specified enthesopathies of unspecified lower limb, excluding foot: Secondary | ICD-10-CM

## 2012-10-13 NOTE — Assessment & Plan Note (Signed)
Significantly improved.

## 2012-10-13 NOTE — Assessment & Plan Note (Signed)
Continue rehabilitation. Intra-articular injection, ultrasound guided into both knees. Return to see me in 4-6 weeks.

## 2012-10-13 NOTE — Progress Notes (Signed)
  Subjective:    CC: Followup  HPI: Trochanteric bursitis: Nearly 100% resolved after injection and home exercises. Only gets occasional twinges.  Bilateral chondromalacia patellae: Overall improved after home rehabilitation and Mobic, but still significantly painful. Pain is localized under the kneecaps, worse with going up stairs and worse with doing squats. No radiation, moderate.  Past medical history, Surgical history, Family history not pertinant except as noted below, Social history, Allergies, and medications have been entered into the medical record, reviewed, and no changes needed.   Review of Systems: No headache, visual changes, nausea, vomiting, diarrhea, constipation, dizziness, abdominal pain, skin rash, fevers, chills, night sweats, weight loss, swollen lymph nodes, body aches, joint swelling, muscle aches, chest pain, shortness of breath, mood changes, visual or auditory hallucinations.   Objective:   General: Well Developed, well nourished, and in no acute distress.  Neuro/Psych: Alert and oriented x3, extra-ocular muscles intact, able to move all 4 extremities, sensation grossly intact. Skin: Warm and dry, no rashes noted.  Respiratory: Not using accessory muscles, speaking in full sentences, trachea midline.  Cardiovascular: Pulses palpable, no extremity edema. Abdomen: Does not appear distended.  Procedure: Real-time Ultrasound Guided Injection of left knee Device: GE Logiq E  Ultrasound guided injection is preferred based studies that show increased duration, increased effect, greater accuracy, decreased procedural pain, increased response rate, and decreased cost with ultrasound guided versus blind injection.  Verbal informed consent obtained.  Time-out conducted.  Noted no overlying erythema, induration, or other signs of local infection.  Skin prepped in a sterile fashion.  Local anesthesia: Topical Ethyl chloride.  With sterile technique and under real time  ultrasound guidance:  25-gauge 1/2 inch needle advanced into the suprapatellar recess under real-time guidance, 2 cc Kenalog 40, 4 cc lidocaine injected easily. Completed without difficulty  Pain immediately resolved suggesting accurate placement of the medication.  Advised to call if fevers/chills, erythema, induration, drainage, or persistent bleeding.  Images permanently stored and available for review in the ultrasound unit.  Impression: Technically successful ultrasound guided injection.  Procedure: Real-time Ultrasound Guided Injection of right knee Device: GE Logiq E  Ultrasound guided injection is preferred based studies that show increased duration, increased effect, greater accuracy, decreased procedural pain, increased response rate, and decreased cost with ultrasound guided versus blind injection.  Verbal informed consent obtained.  Time-out conducted.  Noted no overlying erythema, induration, or other signs of local infection.  Skin prepped in a sterile fashion.  Local anesthesia: Topical Ethyl chloride.  With sterile technique and under real time ultrasound guidance:  25-gauge 1/2 inch needle advanced into the suprapatellar recess under real-time guidance, 2 cc Kenalog 40, 4 cc lidocaine injected easily. Completed without difficulty  Pain immediately resolved suggesting accurate placement of the medication.  Advised to call if fevers/chills, erythema, induration, drainage, or persistent bleeding.  Images permanently stored and available for review in the ultrasound unit.  Impression: Technically successful ultrasound guided injection. Impression and Recommendations:   This case required medical decision making of moderate complexity.

## 2012-11-22 ENCOUNTER — Ambulatory Visit: Payer: BC Managed Care – PPO | Admitting: Sports Medicine

## 2012-12-01 ENCOUNTER — Ambulatory Visit (INDEPENDENT_AMBULATORY_CARE_PROVIDER_SITE_OTHER): Payer: BC Managed Care – PPO | Admitting: Sports Medicine

## 2012-12-01 ENCOUNTER — Ambulatory Visit (INDEPENDENT_AMBULATORY_CARE_PROVIDER_SITE_OTHER): Payer: BC Managed Care – PPO

## 2012-12-01 ENCOUNTER — Encounter: Payer: Self-pay | Admitting: Sports Medicine

## 2012-12-01 VITALS — BP 119/79 | HR 88 | Wt 222.0 lb

## 2012-12-01 DIAGNOSIS — M2240 Chondromalacia patellae, unspecified knee: Secondary | ICD-10-CM

## 2012-12-01 DIAGNOSIS — M47817 Spondylosis without myelopathy or radiculopathy, lumbosacral region: Secondary | ICD-10-CM

## 2012-12-01 DIAGNOSIS — G5710 Meralgia paresthetica, unspecified lower limb: Secondary | ICD-10-CM

## 2012-12-01 DIAGNOSIS — G5712 Meralgia paresthetica, left lower limb: Secondary | ICD-10-CM | POA: Insufficient documentation

## 2012-12-01 DIAGNOSIS — M79609 Pain in unspecified limb: Secondary | ICD-10-CM

## 2012-12-01 MED ORDER — PREDNISONE 50 MG PO TABS: ORAL_TABLET | ORAL | Status: DC

## 2012-12-01 NOTE — Assessment & Plan Note (Signed)
Improved after injection. Continue McConnell taping. Continue rehabilitation exercises aggressively.

## 2012-12-01 NOTE — Patient Instructions (Addendum)
Meralgia paresthetica versus L3 radiculitis

## 2012-12-01 NOTE — Progress Notes (Signed)
  Subjective:    CC: Followup  HPI: Bilateral patellofemoral chondromalacia: Status post formal physical therapy and bilateral ultrasound guided injections, McConnell taping. Overall resolved.  Left thigh burning: Present for a couple weeks but has been present on and off prior. Unclear as to what makes symptoms worse or better, described as a burning pain on the anterolateral thigh but not past the knee. It does not seem to be positional, and is not worse with Valsalva. Patient denies wearing tight clothes over the past few weeks. Denies any pain coming down the leg.  Past medical history, Surgical history, Family history not pertinant except as noted below, Social history, Allergies, and medications have been entered into the medical record, reviewed, and no changes needed.   Review of Systems: No headache, visual changes, nausea, vomiting, diarrhea, constipation, dizziness, abdominal pain, skin rash, fevers, chills, night sweats, weight loss, swollen lymph nodes, body aches, joint swelling, muscle aches, chest pain, shortness of breath, mood changes, visual or auditory hallucinations.   Objective:   General: Well Developed, well nourished, and in no acute distress.  Neuro/Psych: Alert and oriented x3, extra-ocular muscles intact, able to move all 4 extremities, sensation grossly intact. Skin: Warm and dry, no rashes noted.  Respiratory: Not using accessory muscles, speaking in full sentences, trachea midline.  Cardiovascular: Pulses palpable, no extremity edema. Abdomen: Does not appear distended. Left hip: There is discrete pain to palpation just medial to the anterior superior iliac spine. Reflexes are normal.  Impression and Recommendations:   This case required medical decision making of moderate complexity.

## 2012-12-01 NOTE — Assessment & Plan Note (Signed)
Whether this represents an L3 radiculitis versus meralgia paresthetica is unclear at this point. I am going to treat for both. Prednisone, back exercises. X-ray of the lumbar spine. Loose fitting clothing for the next couple of weeks. Return in a month to touch base again regarding this.

## 2012-12-30 ENCOUNTER — Encounter: Payer: Self-pay | Admitting: Sports Medicine

## 2012-12-30 ENCOUNTER — Ambulatory Visit (INDEPENDENT_AMBULATORY_CARE_PROVIDER_SITE_OTHER): Payer: BC Managed Care – PPO | Admitting: Sports Medicine

## 2012-12-30 ENCOUNTER — Ambulatory Visit: Payer: BC Managed Care – PPO

## 2012-12-30 VITALS — BP 125/83 | HR 80 | Wt 224.0 lb

## 2012-12-30 DIAGNOSIS — G5712 Meralgia paresthetica, left lower limb: Secondary | ICD-10-CM

## 2012-12-30 DIAGNOSIS — M2240 Chondromalacia patellae, unspecified knee: Secondary | ICD-10-CM

## 2012-12-30 DIAGNOSIS — G5710 Meralgia paresthetica, unspecified lower limb: Secondary | ICD-10-CM

## 2012-12-30 DIAGNOSIS — M62838 Other muscle spasm: Secondary | ICD-10-CM | POA: Insufficient documentation

## 2012-12-30 NOTE — Assessment & Plan Note (Signed)
Improvement and near resolution with prednisone and back exercises. She still does have some pain at the left anterior superior iliac spine, with pressure over this area does not reproduce her numbness on her lateral thigh. Reproduction of pain with sitting in the car points more towards lumbar radiculitis. Continue back exercises, and we will recheck this in 3 months. If no better, I would certainly consider MRI of the lumbar spine for interventional injection planning.

## 2012-12-30 NOTE — Progress Notes (Signed)
  Subjective:    CC: Follow up  HPI: Patellofemoral chondromalacia: Pain-free after injection, 2 months ago. Continues to do exercises, still has some stiffness and grinding but no pain.  Lateral thigh numbness: Differential diagnosis was L3 radiculitis versus neuralgia paresthetica. I treated her with prednisone and back exercises, and she is improved. She still has pain to palpation over the anterior superior iliac spine, but feels as though this is a different process as the numbness in her lateral thigh. She did have reproduction of the numbness when sitting in the car for a long period of time.  Neck pain: Was doing a sit up, and had intense pain along the right lateral side of her neck radiating into her lateral face and down over lateral trapezius. No pain down the arms, this was temporary and resolved.  Past medical history, Surgical history, Family history not pertinant except as noted below, Social history, Allergies, and medications have been entered into the medical record, reviewed, and no changes needed.   Review of Systems: No fevers, chills, night sweats, weight loss, chest pain, or shortness of breath.   Objective:    General: Well Developed, well nourished, and in no acute distress.  Neuro: Alert and oriented x3, extra-ocular muscles intact, sensation grossly intact.  HEENT: Normocephalic, atraumatic, pupils equal round reactive to light, neck supple, no masses, no lymphadenopathy, thyroid nonpalpable.  Skin: Warm and dry, no rashes. Cardiac: Regular rate and rhythm, no murmurs rubs or gallops, no lower extremity edema.  Respiratory: Clear to auscultation bilaterally. Not using accessory muscles, speaking in full sentences. Neck: Inspection unremarkable. No palpable stepoffs. Negative Spurling's maneuver. Full neck range of motion Grip strength and sensation normal in bilateral hands Strength good C4 to T1 distribution No sensory change to C4 to T1 Negative Hoffman  sign bilaterally Reflexes normal Impression and Recommendations:

## 2012-12-30 NOTE — Assessment & Plan Note (Signed)
Pain-free, with some tightness but this continues to improve. I injected her 2 months ago. Continue exercises. Recheck this in 3 months.

## 2012-12-30 NOTE — Assessment & Plan Note (Signed)
Mobic as needed, heating pad. Home exercises. X-rays. Recheck this in a few months.

## 2013-01-04 ENCOUNTER — Ambulatory Visit (INDEPENDENT_AMBULATORY_CARE_PROVIDER_SITE_OTHER): Payer: BC Managed Care – PPO

## 2013-01-04 DIAGNOSIS — M5380 Other specified dorsopathies, site unspecified: Secondary | ICD-10-CM

## 2013-01-04 DIAGNOSIS — M542 Cervicalgia: Secondary | ICD-10-CM

## 2013-01-04 DIAGNOSIS — M62838 Other muscle spasm: Secondary | ICD-10-CM

## 2013-03-31 ENCOUNTER — Ambulatory Visit: Payer: BC Managed Care – PPO | Admitting: Sports Medicine

## 2013-07-11 ENCOUNTER — Telehealth: Payer: Self-pay | Admitting: Critical Care Medicine

## 2013-07-11 NOTE — Telephone Encounter (Signed)
Called Patient to set up follow up apt, Left message x3. No return call back. Sent letter 07/11/13  ° °

## 2013-10-05 IMAGING — CR DG CERVICAL SPINE COMPLETE 4+V
6 series · 6 of 6 positions shown · non-contrast
Comparison: None.

CLINICAL DATA: Right sided neck pain and spasm.

CERVICAL SPINE - COMPLETE 4+ VIEW

[view not recorded (1 of 6)]
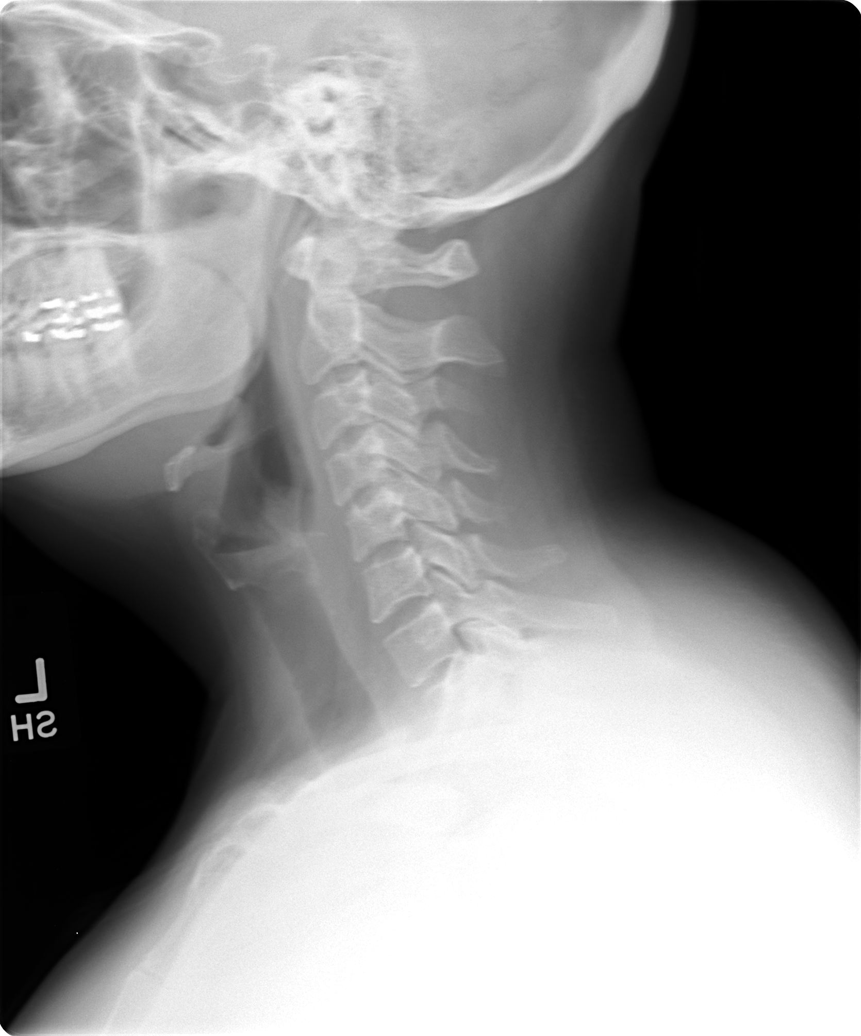

[view not recorded (2 of 6)]
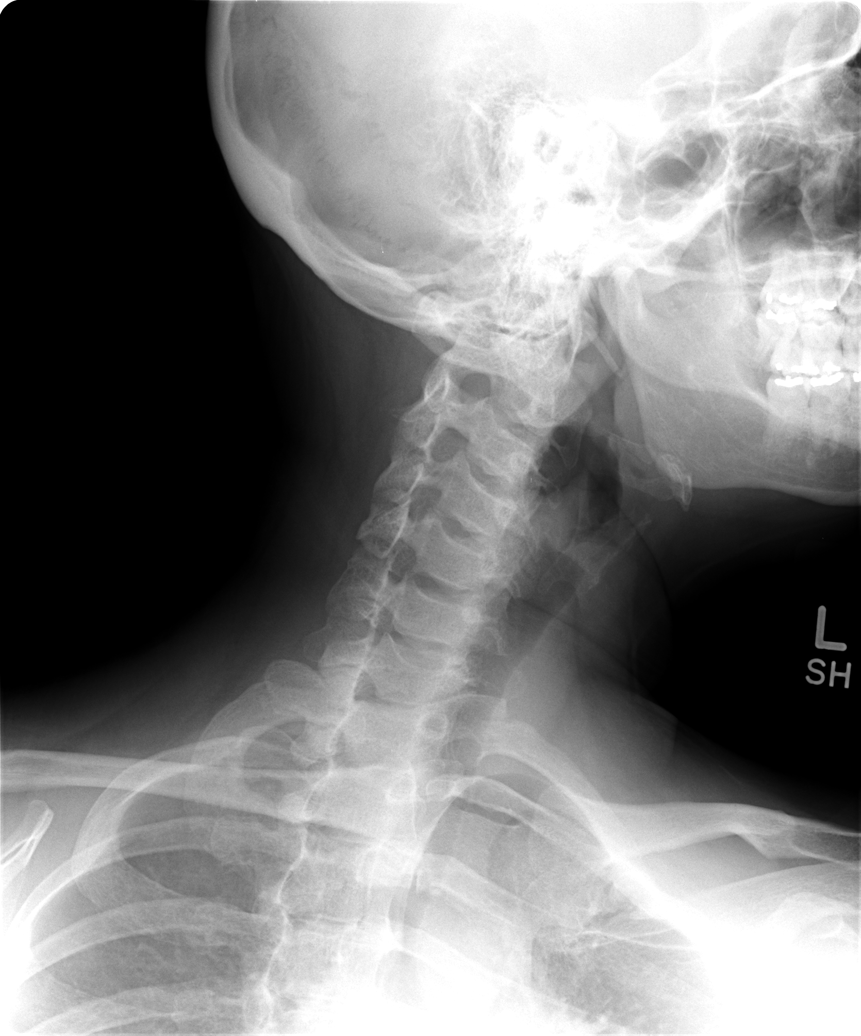

[view not recorded (3 of 6)]
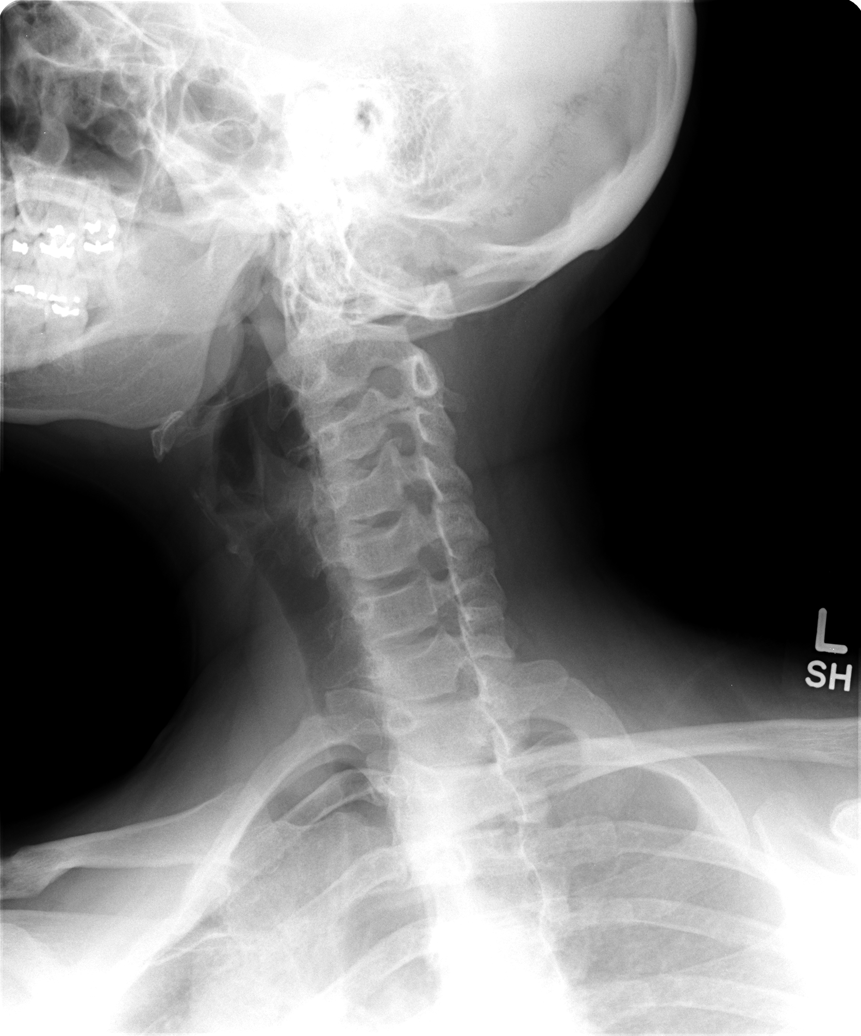

[view not recorded (4 of 6)]
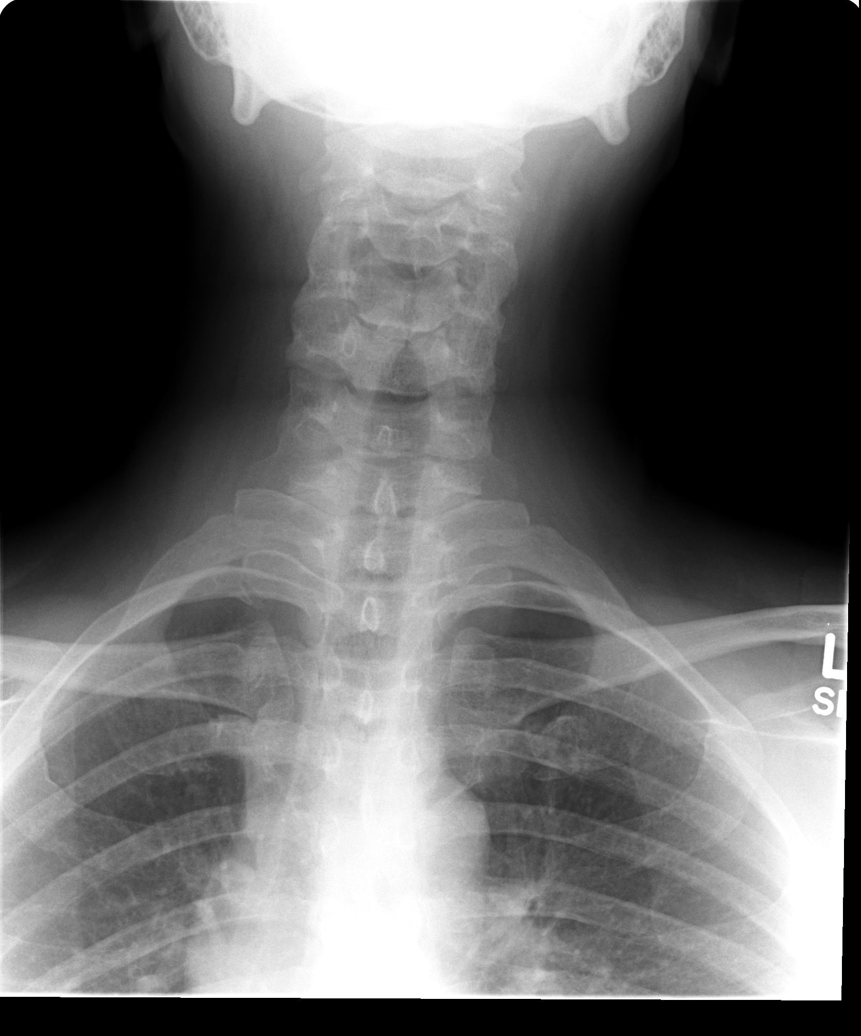

[view not recorded (5 of 6)]
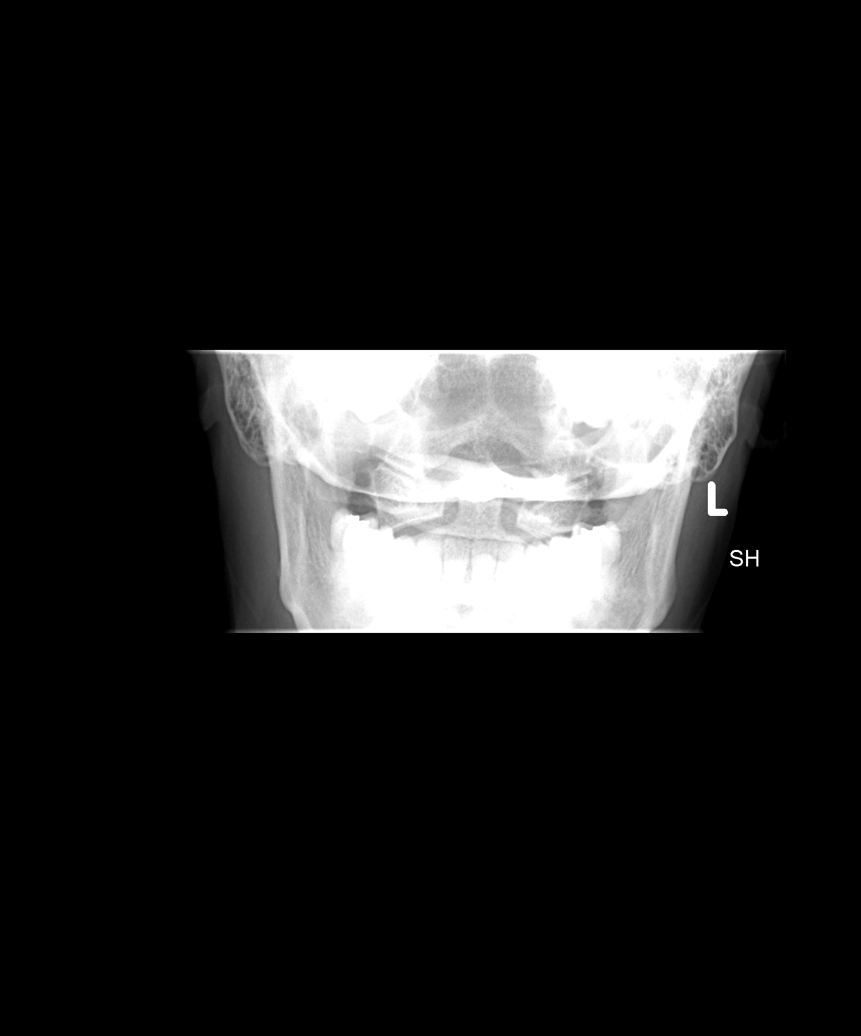

[view not recorded (6 of 6)]
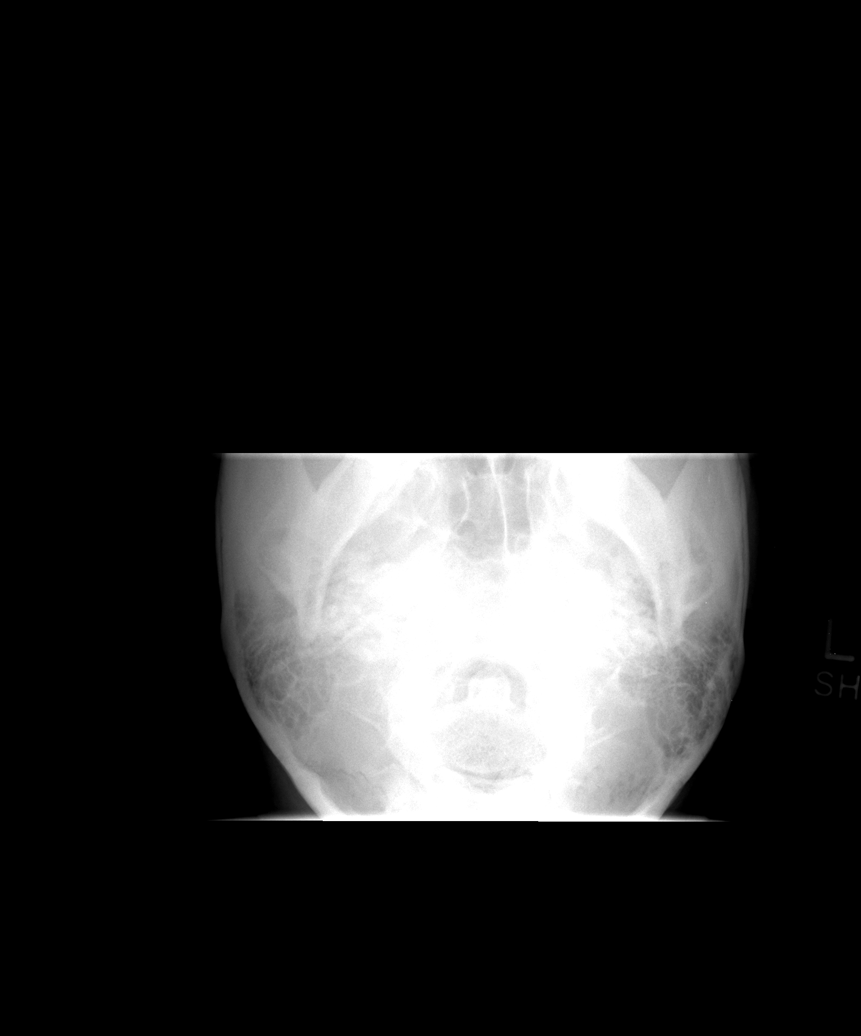

[6 of 6 positions shown; findings below may reference images not displayed]

FINDINGS: There is no fracture or subluxation or prevertebral soft
tissue swelling.  Disc spaces are well maintained.  No foraminal
stenosis.  Small uncinate spur extending into the left neural
foramen at to C3-4.  No facet arthritis.
IMPRESSION: Slight uncinate spurring at C3-4 to the left.  Otherwise, normal
exam.

## 2016-03-14 ENCOUNTER — Ambulatory Visit (INDEPENDENT_AMBULATORY_CARE_PROVIDER_SITE_OTHER): Payer: BLUE CROSS/BLUE SHIELD | Admitting: Sports Medicine

## 2016-03-14 ENCOUNTER — Encounter: Payer: Self-pay | Admitting: Sports Medicine

## 2016-03-14 VITALS — BP 117/78 | HR 101 | Resp 18 | Wt 258.3 lb

## 2016-03-14 DIAGNOSIS — M7062 Trochanteric bursitis, left hip: Secondary | ICD-10-CM

## 2016-03-14 NOTE — Assessment & Plan Note (Signed)
3 year response to previous injection, repeat injection again, ultrasound guided.

## 2016-03-14 NOTE — Progress Notes (Signed)
   Subjective:    I'm seeing this patient as a consultation for:  Primus Bravoiffany Gibson, NP  CC: Left hip pain  HPI: This is a pleasant 50 year old female, I have not seen her in over 3 years, over 3 years ago we did a ultrasound-guided trochanteric bursa injection with fantastic response, her pain is now recurrent, persistent, severe over the left greater trochanter and she desires repeat interventional treatment today.  Past medical history, Surgical history, Family history not pertinant except as noted below, Social history, Allergies, and medications have been entered into the medical record, reviewed, and no changes needed.   Review of Systems: No headache, visual changes, nausea, vomiting, diarrhea, constipation, dizziness, abdominal pain, skin rash, fevers, chills, night sweats, weight loss, swollen lymph nodes, body aches, joint swelling, muscle aches, chest pain, shortness of breath, mood changes, visual or auditory hallucinations.   Objective:   General: Well Developed, well nourished, and in no acute distress.  Neuro/Psych: Alert and oriented x3, extra-ocular muscles intact, able to move all 4 extremities, sensation grossly intact. Skin: Warm and dry, no rashes noted.  Respiratory: Not using accessory muscles, speaking in full sentences, trachea midline.  Cardiovascular: Pulses palpable, no extremity edema. Abdomen: Does not appear distended. Left Hip: ROM IR: 60 Deg, ER: 60 Deg, Flexion: 120 Deg, Extension: 100 Deg, Abduction: 45 Deg, Adduction: 45 Deg Strength IR: 5/5, ER: 5/5, Flexion: 5/5, Extension: 5/5, Abduction: 5/5, Adduction: 5/5 Pelvic alignment unremarkable to inspection and palpation. Standing hip rotation and gait without trendelenburg / unsteadiness. Greater trochanter with severe tenderness to palpation. No tenderness over piriformis. No SI joint tenderness and normal minimal SI movement.  Procedure: Real-time Ultrasound Guided Injection of left trochanter  bursa Device: GE Logiq E  Verbal informed consent obtained.  Time-out conducted.  Noted no overlying erythema, induration, or other signs of local infection.  Skin prepped in a sterile fashion.  Local anesthesia: Topical Ethyl chloride.  With sterile technique and under real time ultrasound guidance:  Spinal needle advanced just deep to the insertion of the gluteus medius to the greater trochanter, 1 mL kenalog 40, 2 mL lidocaine, 2 mL Marcaine injected easily. Completed without difficulty  Pain immediately resolved suggesting accurate placement of the medication.  Advised to call if fevers/chills, erythema, induration, drainage, or persistent bleeding.  Images permanently stored and available for review in the ultrasound unit.  Impression: Technically successful ultrasound guided injection.  Impression and Recommendations:   This case required medical decision making of moderate complexity.

## 2016-08-21 ENCOUNTER — Encounter: Payer: Self-pay | Admitting: Sports Medicine

## 2016-08-21 ENCOUNTER — Ambulatory Visit (INDEPENDENT_AMBULATORY_CARE_PROVIDER_SITE_OTHER): Payer: BLUE CROSS/BLUE SHIELD | Admitting: Sports Medicine

## 2016-08-21 DIAGNOSIS — M7062 Trochanteric bursitis, left hip: Secondary | ICD-10-CM | POA: Diagnosis not present

## 2016-08-21 NOTE — Patient Instructions (Signed)
Hip Rehabilitation Protocol:  1.  Side leg raises.  3x30 with no weight, then 3x15 with 2 lb ankle weight, then 3x15 with 5 lb ankle weight 2.  Standing hip rotation.  3x30 with no weight, then 3x15 with 2 lb ankle weight, then 3x15 with 5 lb ankle weight. 3.  Side step ups.  3x30 with no weight, then 3x15 with 5 lbs in backpack, then 3x15 with 10 lbs in backpack. 

## 2016-08-21 NOTE — Assessment & Plan Note (Signed)
Previous injection was 5 months ago, repeat left greater trochanteric bursa injection today. She has slacked off on her rehabilitation exercises, she will get back on the bandwagon. Return as needed.

## 2016-08-21 NOTE — Progress Notes (Signed)
  Subjective:    CC: Left hip pain  HPI: This is a pleasant 50 year old female, we have injected her left greater trochanteric bursa in the past, first injection lasted over a year, the second injection provided some 7 or 8 months of relief. She has not been doing her exercises and not surprisingly has a recurrence of pain. Severe, persistent, localized over the left greater trochanter. No radiation.  Past medical history:  Negative.  See flowsheet/record as well for more information.  Surgical history: Negative.  See flowsheet/record as well for more information.  Family history: Negative.  See flowsheet/record as well for more information.  Social history: Negative.  See flowsheet/record as well for more information.  Allergies, and medications have been entered into the medical record, reviewed, and no changes needed.   Review of Systems: No fevers, chills, night sweats, weight loss, chest pain, or shortness of breath.   Objective:    General: Well Developed, well nourished, and in no acute distress.  Neuro: Alert and oriented x3, extra-ocular muscles intact, sensation grossly intact.  HEENT: Normocephalic, atraumatic, pupils equal round reactive to light, neck supple, no masses, no lymphadenopathy, thyroid nonpalpable.  Skin: Warm and dry, no rashes. Cardiac: Regular rate and rhythm, no murmurs rubs or gallops, no lower extremity edema.  Respiratory: Clear to auscultation bilaterally. Not using accessory muscles, speaking in full sentences. Left Hip: ROM IR: 60 Deg, ER: 60 Deg, Flexion: 120 Deg, Extension: 100 Deg, Abduction: 45 Deg, Adduction: 45 Deg Strength IR: 5/5, ER: 5/5, Flexion: 5/5, Extension: 5/5, Abduction: 5/5, Adduction: 5/5 Pelvic alignment unremarkable to inspection and palpation. Standing hip rotation and gait without trendelenburg / unsteadiness. Greater trochanter with tenderness to palpation. No tenderness over piriformis. No SI joint tenderness and normal  minimal SI movement.  Procedure: Real-time Ultrasound Guided Injection of left greater trochanteric bursa Device: GE Logiq E  Verbal informed consent obtained.  Time-out conducted.  Noted no overlying erythema, induration, or other signs of local infection.  Skin prepped in a sterile fashion.  Local anesthesia: Topical Ethyl chloride.  With sterile technique and under real time ultrasound guidance: Using a 22-gauge 7 inch spinal needle advanced into the greater trochanteric bursa and injected 1 mL kenalog 40, 2 mL lidocaine, 2 mL Marcaine. Completed without difficulty  Pain immediately resolved suggesting accurate placement of the medication.  Advised to call if fevers/chills, erythema, induration, drainage, or persistent bleeding.  Images permanently stored and available for review in the ultrasound unit.  Impression: Technically successful ultrasound guided injection.  Impression and Recommendations:    Greater trochanteric bursitis of left hip Previous injection was 5 months ago, repeat left greater trochanteric bursa injection today. She has slacked off on her rehabilitation exercises, she will get back on the bandwagon. Return as needed.

## 2016-08-26 ENCOUNTER — Telehealth: Payer: Self-pay

## 2016-08-26 NOTE — Telephone Encounter (Signed)
Pt left VM stating that you were going to give her Meloxicam. Please advise.

## 2016-08-27 MED ORDER — MELOXICAM 15 MG PO TABS: ORAL_TABLET | ORAL | 3 refills | Status: DC

## 2016-08-27 NOTE — Telephone Encounter (Signed)
Done

## 2017-01-19 ENCOUNTER — Emergency Department
Admission: EM | Admit: 2017-01-19 | Discharge: 2017-01-19 | Disposition: A | Discharge status: Home / Self Care | Payer: BLUE CROSS/BLUE SHIELD | Source: Home / Self Care | Attending: Family Medicine | Admitting: Family Medicine

## 2017-01-19 DIAGNOSIS — W5503XA Scratched by cat, initial encounter: Secondary | ICD-10-CM

## 2017-01-19 DIAGNOSIS — S61452A Open bite of left hand, initial encounter: Secondary | ICD-10-CM

## 2017-01-19 DIAGNOSIS — S50811A Abrasion of right forearm, initial encounter: Secondary | ICD-10-CM

## 2017-01-19 DIAGNOSIS — W5501XA Bitten by cat, initial encounter: Secondary | ICD-10-CM | POA: Diagnosis not present

## 2017-01-19 DIAGNOSIS — L089 Local infection of the skin and subcutaneous tissue, unspecified: Secondary | ICD-10-CM | POA: Diagnosis not present

## 2017-01-19 DIAGNOSIS — S50812A Abrasion of left forearm, initial encounter: Secondary | ICD-10-CM

## 2017-01-19 HISTORY — DX: Type 2 diabetes mellitus without complications: E11.9

## 2017-01-19 HISTORY — DX: Essential (primary) hypertension: I10

## 2017-01-19 MED ORDER — DOXYCYCLINE HYCLATE 100 MG PO CAPS: 100.0000 mg | ORAL_CAPSULE | Freq: Two times a day (BID) | ORAL | 0 refills | Status: DC

## 2017-01-19 MED ORDER — CLINDAMYCIN HCL 300 MG PO CAPS: 300.0000 mg | ORAL_CAPSULE | Freq: Four times a day (QID) | ORAL | 0 refills | Status: DC

## 2017-01-19 NOTE — ED Provider Notes (Signed)
CSN: [id number]     Arrival date & time 01/19/17  0845 History   First MD Initiated Contact with Patient 01/19/17 0920     Chief Complaint  Patient presents with  . Animal Bite    both lower arms and hands   (Consider location/radiation/quality/duration/timing/severity/associated sxs/prior Treatment) HPI Carrie Baker is a 51 y.o. female presenting to UC with c/o multiple cat scratches and bites to both hands and forearms from 2 days ago.  Incident occurred when pt was keeping her cat from attacking another cat around 4PM Saturday, 01/17/17.  Cat is UTD on rabies vaccine. Pt's last tetanus was 6 years ago.  Pain is mildly aching and sore. Bites on Left hand have become more red and swollen.  Pain in Right index finger from a bite or scratch.  Denies fever, chills, n/v/d.     Past Medical History:  Diagnosis Date  . Diabetes mellitus without complication (HCC)    pre diabetes  . Fibromyalgia   . Hypertension   . Seasonal allergies    Past Surgical History:  Procedure Laterality Date  . CHOLECYSTECTOMY    . COLON SURGERY    . PALATE / UVULA BIOPSY / EXCISION    . SINUS SURGERY WITH INSTATRAK     Family History  Problem Relation Age of Onset  . Scleroderma Mother   . Diabetes Father   . Cancer Father        colon  . Colon cancer Father   . Leukemia Brother        half brother  . Emphysema Paternal Aunt   . Emphysema Paternal Grandfather   . Emphysema Maternal Grandfather   . Heart disease Maternal Grandfather   . Melanoma Paternal Aunt    Social History  Substance Use Topics  . Smoking status: Never Smoker  . Smokeless tobacco: Never Used  . Alcohol use No   OB History    No data available     Review of Systems  Constitutional: Negative for chills and fever.  Gastrointestinal: Negative for diarrhea, nausea and vomiting.  Musculoskeletal: Positive for arthralgias, joint swelling and myalgias.  Skin: Positive for color change and wound.    Allergies   Demerol; Penicillins; and Sulfamethoxazole-trimethoprim  Home Medications   Prior to Admission medications   Medication Sig Start Date End Date Taking? Authorizing Provider  clindamycin (CLEOCIN) 300 MG capsule Take 1 capsule (300 mg total) by mouth 4 (four) times daily. X 7 days 01/19/17   Junius Finner, PA-C  doxycycline (VIBRAMYCIN) 100 MG capsule Take 1 capsule (100 mg total) by mouth 2 (two) times daily. One po bid x 7 days 01/19/17   Junius Finner, PA-C  DULoxetine (CYMBALTA) 30 MG capsule Take 3 every evening 08/04/16   [provider]  gabapentin (NEURONTIN) 300 MG capsule Take by mouth. 08/04/16 08/04/17  [provider]  levothyroxine (SYNTHROID) 125 MCG tablet Take by mouth. 07/30/16 07/30/17  [provider]  losartan (COZAAR) 100 MG tablet Take by mouth. 08/04/16 08/04/17  [provider]  meloxicam (MOBIC) 15 MG tablet One tab PO qAM with breakfast for 2 weeks, then daily prn pain. 08/27/16   Monica Becton, MD  metFORMIN (GLUCOPHAGE-XR) 500 MG 24 hr tablet Take by mouth. 07/30/16   [provider]  norethindrone-ethinyl estradiol (GILDESS FE 1/20) 1-20 MG-MCG tablet Take 1 tablet by mouth daily.    [provider]   Meds Ordered and Administered this Visit  Medications - No data to display  BP (!) 137/92 (BP Location: Left Arm)   Pulse 93   Temp 98.4 F (36.9 C) (Oral)   Ht 5' 4.5" (1.638 m)   Wt 264 lb (119.7 kg)   LMP 12/21/2012   SpO2 97%   BMI 44.62 kg/m  No data found.   Physical Exam  Constitutional: She is oriented to person, place, and time. She appears well-developed and well-nourished. No distress.  HENT:  Head: Normocephalic and atraumatic.  Eyes: EOM are normal.  Neck: Normal range of motion.  Cardiovascular: Normal rate.   Pulmonary/Chest: Effort normal.  Musculoskeletal: She exhibits edema and tenderness.  Right index finger: no edema, slight decreased ROM at DIP. Tender.  Left hand:  mild edema with tenderness over 5th metacarpal. Full ROM wrist and fingers.  Bilateral forearms- muscle compartments soft, non-tender.  Neurological: She is alert and oriented to person, place, and time.  Skin: Skin is warm and dry. She is not diaphoretic.  Bilateral hands and forearms: multiple scratches and puncture wounds.  Wounds have scabbed over.   Right index finger: Pin-point abrasion. No erythema, edema, or warmth. Tender. Left hand: mild edema with erythema, overlying abrasion and scabbed puncture wounds. Tender. No crepitus. No drainage.   Psychiatric: She has a normal mood and affect. Her behavior is normal.  Nursing note and vitals reviewed.   Urgent Care Course     Procedures (including critical care time)  Labs Review Labs Reviewed - No data to display  Imaging Review No results found.  Wounds cleaned with saline. Bacitracin and bandages applied.    MDM   1. Cat bite of left hand with infection, initial encounter   2. Cat bite, initial encounter   3. Cat scratch of right forearm, initial encounter   4. Cat scratch of left forearm, initial encounter    Multiple cat bites and scratches. Rabies and tetanus UTD  Pt allergic (throat swelling) to PCN.  Rx: Doxycycline and Clindamycin Home care instructions provided. f/u in 3-4 days if needed or sooner if significantly worsening.   Junius FinnerO'Malley, Effrey Davidow, PA-C 01/19/17 1006

## 2017-01-19 NOTE — ED Triage Notes (Signed)
Pt was keeping her cat from attacking another cat Saturday afternoon around 4, and has multiple bites and scratches on lower arms and hands.

## 2017-01-22 ENCOUNTER — Telehealth: Payer: Self-pay | Admitting: *Deleted

## 2017-01-22 NOTE — Telephone Encounter (Signed)
Callback: Patient reports she is much improved. Encouraged completion of antibiotic. Call back if worsening.

## 2017-05-08 ENCOUNTER — Other Ambulatory Visit: Payer: Self-pay | Admitting: Sports Medicine

## 2017-06-24 ENCOUNTER — Other Ambulatory Visit: Payer: Self-pay | Admitting: Sports Medicine

## 2018-01-14 ENCOUNTER — Other Ambulatory Visit: Payer: Self-pay | Admitting: Sports Medicine

## 2018-04-15 ENCOUNTER — Other Ambulatory Visit: Payer: Self-pay | Admitting: Sports Medicine

## 2018-07-20 ENCOUNTER — Other Ambulatory Visit: Payer: Self-pay | Admitting: Sports Medicine

## 2018-09-06 ENCOUNTER — Other Ambulatory Visit: Payer: Self-pay

## 2018-09-06 ENCOUNTER — Emergency Department
Admission: EM | Admit: 2018-09-06 | Discharge: 2018-09-06 | Disposition: A | Discharge status: Home / Self Care | Payer: BLUE CROSS/BLUE SHIELD | Source: Home / Self Care | Attending: Family Medicine | Admitting: Family Medicine

## 2018-09-06 DIAGNOSIS — J069 Acute upper respiratory infection, unspecified: Secondary | ICD-10-CM | POA: Diagnosis not present

## 2018-09-06 DIAGNOSIS — B9789 Other viral agents as the cause of diseases classified elsewhere: Secondary | ICD-10-CM

## 2018-09-06 MED ORDER — GUAIFENESIN-CODEINE 100-10 MG/5ML PO SOLN: ORAL | 0 refills | Status: AC

## 2018-09-06 MED ORDER — CLARITHROMYCIN 500 MG PO TABS: ORAL_TABLET | ORAL | 0 refills | Status: AC

## 2018-09-06 NOTE — ED Triage Notes (Signed)
Started Thursday with Nausea, stomach cramping, diarrhea, sweats, low grade fever.  Everything has resolved except for a productive cough with greenish phlegm.

## 2018-09-06 NOTE — ED Provider Notes (Signed)
[facility]    CSN: [id number] Arrival date & time: 09/06/18  1819     History   Chief Complaint Chief Complaint  Patient presents with  . Cough    HPI Carrie Baker is a 52 y.o. female.   Four days ago patient developed stomach cramps, nausea without vomiting, diarrhea, sweats, and low grade fever.  The next day her GI symptoms had resolved and she developed sinus congestion, cough, persistent low grade fever, and occasional wheezing.  No pleuritic pain or shortness of breath.  The history is provided by the patient.    Past Medical History:  Diagnosis Date  . Diabetes mellitus without complication (HCC)    pre diabetes  . Fibromyalgia   . Hypertension   . Seasonal allergies     Patient Active Problem List   Diagnosis Date Noted  . Greater trochanteric bursitis of left hip 03/14/2016  . Neck muscle spasm 12/30/2012  . Meralgia paresthetica of left side 12/01/2012  . 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 Fibromyalgia     Past Surgical History:  Procedure Laterality Date  . CHOLECYSTECTOMY    . COLON SURGERY    . PALATE / UVULA BIOPSY / EXCISION    . SINUS SURGERY WITH INSTATRAK      OB History   No obstetric history on file.      Home Medications    Prior to Admission medications   Medication Sig Start Date End Date Taking? Authorizing Provider  clarithromycin (BIAXIN) 500 MG tablet Take one tab PO BID for 10 days. 09/06/18   Lattie HawBeese, Micha Erck A, MD  DULoxetine (CYMBALTA) 30 MG capsule TAKE 3 CAPSULES EVERY EVENING 04/15/18   Monica Bectonhekkekandam, Thomas J, MD  gabapentin (NEURONTIN) 300 MG capsule Take by mouth. 08/04/16 08/04/17  [provider]  guaiFENesin-codeine 100-10 MG/5ML syrup Take 10mL by mouth at bedtime as needed for cough.  May repeat dose in 4 to 6 hours. 09/06/18   Lattie HawBeese, Jalan Fariss A, MD  levothyroxine (SYNTHROID) 125 MCG tablet Take by mouth. 07/30/16  07/30/17  [provider]  losartan (COZAAR) 100 MG tablet Take by mouth. 08/04/16 08/04/17  [provider]  meloxicam (MOBIC) 15 MG tablet TAKE 1 TABLET IN THE MORNING WITH BREAKFAST FOR 2 WEEKS THEN DAILY AS NEEDED FOR PAIN 05/08/17   Monica Bectonhekkekandam, Thomas J, MD  metFORMIN (GLUCOPHAGE-XR) 500 MG 24 hr tablet Take by mouth. 07/30/16   [provider]  norethindrone-ethinyl estradiol (GILDESS FE 1/20) 1-20 MG-MCG tablet Take 1 tablet by mouth daily.    [provider]    Family History Family History  Problem Relation Age of Onset  . Scleroderma Mother   . Diabetes Father   . Cancer Father        colon  . Colon cancer Father   . Leukemia Brother        half brother  . Emphysema Paternal Aunt   . Emphysema Paternal Grandfather   . Emphysema Maternal Grandfather   . Heart disease Maternal Grandfather   . Melanoma Paternal Aunt     Social History Social History   Tobacco Use  . Smoking status: Never Smoker  . Smokeless tobacco: Never Used  Substance Use Topics  . Alcohol use: No  . Drug use: No     Allergies   Demerol; Penicillins; and Sulfamethoxazole-trimethoprim   Review of Systems Review of Systems No sore throat + cough No  pleuritic pain ? wheezing + nasal congestion + post-nasal drainage No sinus pain/pressure No itchy/red eyes No earache No hemoptysis No SOB + fever, + chills + nausea No vomiting No abdominal pain + diarrhea, resolved No urinary symptoms No skin rash + fatigue No myalgiasNo headache Used OTC meds without relief   Physical Exam Triage Vital Signs ED Triage Vitals [09/06/18 1954]  Enc Vitals Group     BP (!) 148/94     Pulse Rate 92     Resp 18     Temp 99.1 F (37.3 C)     Temp Source Oral     SpO2 98 %     Weight 240 lb (108.9 kg)     Height 5\' 5"  (1.651 m)     Head Circumference      Peak Flow      Pain Score      Pain Loc      Pain Edu?      Excl. in GC?    No data  found.  Updated Vital Signs BP (!) 148/94 (BP Location: Right Arm)   Pulse 92   Temp 99.1 F (37.3 C) (Oral)   Resp 18   Ht 5\' 5"  (1.651 m)   Wt 108.9 kg   LMP 12/21/2012   SpO2 98%   BMI 39.94 kg/m   Visual Acuity Right Eye Distance:   Left Eye Distance:   Bilateral Distance:    Right Eye Near:   Left Eye Near:    Bilateral Near:     Physical Exam Nursing notes and Vital Signs reviewed. Appearance:  Patient appears stated age, and in no acute distress Eyes:  Pupils are equal, round, and reactive to light and accomodation.  Extraocular movement is intact.  Conjunctivae are not inflamed  Ears:  Canals normal.  Tympanic membranes normal.  Nose:  Mildly congested turbinates.  No sinus tenderness.    Pharynx:  Normal Neck:  Supple.  Enlarged posterior/lateral nodes are palpated bilaterally, tender to palpation on the left.   Lungs:  Clear to auscultation.  Breath sounds are equal.  Moving air well. Chest:  Distinct tenderness to palpation over the mid-sternum.  Heart:  Regular rate and rhythm without murmurs, rubs, or gallops.  Abdomen:  Nontender without masses or hepatosplenomegaly.  Bowel sounds are present.  No CVA or flank tenderness.  Extremities:  No edema.  Skin:  No rash present.    UC Treatments / Results  Labs (all labs ordered are listed, but only abnormal results are displayed) Labs Reviewed - No data to display  EKG None  Radiology No results found.  Procedures Procedures (including critical care time)  Medications Ordered in UC Medications - No data to display  Initial Impression / Assessment and Plan / UC Course  I have reviewed the triage vital signs and the nursing notes.  Pertinent labs & imaging results that were available during my care of the patient were reviewed by me and considered in my medical decision making (see chart for details).    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  Begin Biaxin. Rx for Robitussin AC for night  time cough.  Controlled Substance Prescriptions I have consulted the [place] Controlled Substances Registry for this patient, and feel the risk/benefit ratio today is favorable for proceeding with this prescription for a controlled substance.   Followup with Family Doctor if not improved in 10 days.   Final Clinical Impressions(s) / UC Diagnoses   Final diagnoses:  Viral  URI with cough     Discharge Instructions     Take plain guaifenesin (1200mg  extended release tabs such as Mucinex) twice daily, with plenty of water, for cough and congestion.  Get adequate rest.   May use Afrin nasal spray (or generic oxymetazoline) each morning for about 5 days and then discontinue.  Also recommend using saline nasal spray several times daily and saline nasal irrigation (AYR is a common brand).  Use Flonase nasal spray each morning after using Afrin nasal spray and saline nasal irrigation. Try warm salt water gargles for sore throat.  Stop all antihistamines for now, and other non-prescription cough/cold preparations.          ED Prescriptions    Medication Sig Dispense Auth. Provider   clarithromycin (BIAXIN) 500 MG tablet Take one tab PO BID for 10 days. 20 tablet Lattie Haw, MD   guaiFENesin-codeine 100-10 MG/5ML syrup Take 10mL by mouth at bedtime as needed for cough.  May repeat dose in 4 to 6 hours. 100 mL Lattie Haw, MD         Lattie Haw, MD 09/14/18 [phone]

## 2018-09-06 NOTE — Discharge Instructions (Addendum)
Take plain guaifenesin (1200mg extended release tabs such as Mucinex) twice daily, with plenty of water, for cough and congestion.  Get adequate rest.   °May use Afrin nasal spray (or generic oxymetazoline) each morning for about 5 days and then discontinue.  Also recommend using saline nasal spray several times daily and saline nasal irrigation (AYR is a common brand).  Use Flonase nasal spray each morning after using Afrin nasal spray and saline nasal irrigation. °Try warm salt water gargles for sore throat.  °Stop all antihistamines for now, and other non-prescription cough/cold preparations. °  °  °

## 2018-09-12 ENCOUNTER — Telehealth: Payer: Self-pay

## 2018-09-12 MED ORDER — CLARITHROMYCIN 500 MG PO TABS: 500.0000 mg | ORAL_TABLET | Freq: Two times a day (BID) | ORAL | 0 refills | Status: AC

## 2018-09-12 NOTE — Telephone Encounter (Signed)
Pt called and stated that her cat knocked over her pills after 4 days.  Dr Christianne Borrow for 6 more days for complete treatment.

## 2019-02-28 ENCOUNTER — Other Ambulatory Visit: Payer: Self-pay

## 2019-02-28 ENCOUNTER — Emergency Department (INDEPENDENT_AMBULATORY_CARE_PROVIDER_SITE_OTHER): Admission: EM | Admit: 2019-02-28 | Discharge: 2019-02-28 | Payer: BC Managed Care – PPO | Source: Home / Self Care

## 2019-02-28 DIAGNOSIS — R0789 Other chest pain: Secondary | ICD-10-CM

## 2019-02-28 DIAGNOSIS — R030 Elevated blood-pressure reading, without diagnosis of hypertension: Secondary | ICD-10-CM

## 2019-02-28 DIAGNOSIS — R509 Fever, unspecified: Secondary | ICD-10-CM | POA: Diagnosis not present

## 2019-02-28 DIAGNOSIS — R000 Tachycardia, unspecified: Secondary | ICD-10-CM

## 2019-02-28 LAB — POCT CBC W AUTO DIFF (K'VILLE URGENT CARE)
Hematocrit: 42.9 % (ref 34.8–46)
Hemoglobin: 14.5 g/dL (ref 11.8–15.5)
Lymphocytes absolute: 2.3 10*3/uL — AB (ref 0.1–1.8)
Lymphocytes relative %: 10.9 % — AB (ref 15–45)
MCH: 30.7 pg (ref 26–32)
MCHC: 33.8 g/dL (ref 32–36.5)
MCV: 91 fL (ref 76.0–111.0)
MPV: 7.7 fL — AB (ref 7.8–11)
Monocyes absolute: 1.5 10*3/uL — AB (ref 0.1–1)
Monocytes relative %: 7 % (ref 2–10)
Neutrophils absolute (GR#): 17.3 10*3/uL — AB (ref 1.7–7.8)
Neutrophils relative % (GR): 82.1 % — AB (ref 44–76)
Platelet count: 424 10*3/uL — AB (ref 140–400)
RBC: 7.42 MIL/uL — AB (ref 3.8–5.1)
RDW: 13.9 % (ref 11.6–14)
WBC: 21.1 10*3/uL — AB (ref 4.5–10.5)

## 2019-02-28 NOTE — ED Provider Notes (Signed)
[facility]    CSN: [id number] Arrival date & time: 02/28/19  0827     History   Chief Complaint Chief Complaint  Patient presents with  . Chest Pain    HPI THEONE BOWELL is a 53 y.o. female.   HPI DENNIS KILLILEA is a 53 y.o. female presenting to UC with c/o persistent chest pain across her chest that started yesterday around 4:30PM.  Pain was 10/10 at one point, sharp with deep breaths, associated nausea that resolved after 1 dry heave.  She reports walking to her room to take a nap and felt dizzy. She sat on the floor for about 1 hour and experienced severe cold sweats and felt her hearing go "foggy".  Pain feels better with sitting up or standing, not lying down.  Pain has persisted but pt states it is much better today, down to a 3/10 at this time compared to yesterday.  She is concerned she may have had a heart attack. She did not have any aspirin to take and she was unsure if it would be safe to take Advil.  No medication tried PTA. Denies prior hx of heart problems and no significant family hx of CAD.  She does have DM but states it is well managed.  She does feel like her BP has not been under control recently, despite taking her BP medications.  Denies cough, congestion, abdominal pain or diarrhea. Denies sick contacts or recent travel. Denies hx of blood clots. Denies SOB at this time.   Past Medical History:  Diagnosis Date  . Diabetes mellitus without complication ([place])    pre diabetes  . Fibromyalgia   . Hypertension   . Seasonal allergies     Patient Active Problem List   Diagnosis Date Noted  . Greater trochanteric bursitis of left hip 03/14/2016  . Neck muscle spasm 12/30/2012  . Meralgia paresthetica of left side 12/01/2012  . 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 Fibromyalgia     Past Surgical History:  Procedure Laterality Date  . CHOLECYSTECTOMY    . COLON  SURGERY    . PALATE / UVULA BIOPSY / EXCISION    . SINUS SURGERY WITH INSTATRAK      OB History   No obstetric history on file.      Home Medications    Prior to Admission medications   Medication Sig Start Date End Date Taking? Authorizing Provider  clarithromycin (BIAXIN) 500 MG tablet Take one tab PO BID for 10 days. 09/06/18   Kandra Nicolas, MD  clarithromycin (BIAXIN) 500 MG tablet Take 1 tablet (500 mg total) by mouth 2 (two) times daily. 09/12/18   Kandra Nicolas, MD  DULoxetine (CYMBALTA) 30 MG capsule TAKE 3 CAPSULES EVERY EVENING 04/15/18   Silverio Decamp, MD  gabapentin (NEURONTIN) 300 MG capsule Take by mouth. 08/04/16 08/04/17  [provider]  guaiFENesin-codeine 100-10 MG/5ML syrup Take 59mL by mouth at bedtime as needed for cough.  May repeat dose in 4 to 6 hours. 09/06/18   Kandra Nicolas, MD  levothyroxine (SYNTHROID) 125 MCG tablet Take by mouth. 07/30/16 07/30/17  [provider]  losartan (COZAAR) 100 MG tablet Take by mouth. 08/04/16 08/04/17  [provider]  meloxicam (MOBIC) 15 MG tablet TAKE 1 TABLET IN THE MORNING WITH BREAKFAST FOR 2 WEEKS THEN DAILY AS NEEDED FOR PAIN 05/08/17   Silverio Decamp, MD  metFORMIN (GLUCOPHAGE-XR) 500 MG 24 hr tablet Take by mouth. 07/30/16   [provider]  norethindrone-ethinyl estradiol (GILDESS FE 1/20) 1-20 MG-MCG tablet Take 1 tablet by mouth daily.    [provider]    Family History Family History  Problem Relation Age of Onset  . Scleroderma Mother   . Diabetes Father   . Cancer Father        colon  . Colon cancer Father   . Leukemia Brother        half brother  . Emphysema Paternal Aunt   . Emphysema Paternal Grandfather   . Emphysema Maternal Grandfather   . Heart disease Maternal Grandfather   . Melanoma Paternal Aunt     Social History Social History   Tobacco Use  . Smoking status: Never Smoker  . Smokeless tobacco: Never Used   Substance Use Topics  . Alcohol use: No  . Drug use: No     Allergies   Demerol, Penicillins, and Sulfamethoxazole-trimethoprim   Review of Systems Review of Systems  Constitutional: Positive for chills, fatigue and fever (subjective).  HENT: Negative for congestion, ear pain, sore throat, trouble swallowing and voice change.   Respiratory: Positive for chest tightness. Negative for cough and shortness of breath.   Cardiovascular: Positive for chest pain. Negative for palpitations.  Gastrointestinal: Positive for nausea. Negative for abdominal pain, diarrhea and vomiting.  Musculoskeletal: Negative for arthralgias, back pain and myalgias.  Skin: Negative for rash.  Neurological: Positive for dizziness and weakness. Negative for headaches.     Physical Exam Triage Vital Signs ED Triage Vitals  Enc Vitals Group     BP      Pulse      Resp      Temp      Temp src      SpO2      Weight      Height      Head Circumference      Peak Flow      Pain Score      Pain Loc      Pain Edu?      Excl. in GC?    Orthostatic VS for the past 24 hrs:  BP- Lying Pulse- Lying BP- Sitting Pulse- Sitting BP- Standing at 0 minutes Pulse- Standing at 0 minutes  02/28/19 0931 138/86 100 128/88 104 (!) 132/99 109    Updated Vital Signs BP (!) 133/103 (BP Location: Left Arm)   Pulse (!) 111   Temp (!) 100.5 F (38.1 C) (Oral)   Resp 20   Ht 5\' 5"  (1.651 m)   Wt 279 lb (126.6 kg)   LMP 12/21/2012   SpO2 97%   BMI 46.43 kg/m   Visual Acuity Right Eye Distance:   Left Eye Distance:   Bilateral Distance:    Right Eye Near:   Left Eye Near:    Bilateral Near:     Physical Exam Vitals signs and nursing note reviewed.  Constitutional:      General: She is not in acute distress.    Appearance: She is well-developed. She is obese. She is not ill-appearing, toxic-appearing or diaphoretic.  HENT:     Head: Normocephalic and atraumatic.  Neck:     Musculoskeletal: Normal range  of motion and neck supple.  Cardiovascular:     Rate and Rhythm: Regular rhythm. Tachycardia present.  Pulmonary:     Effort: Pulmonary effort is normal.     Breath sounds: Examination of the right-lower  field reveals decreased breath sounds. Examination of the left-lower field reveals decreased breath sounds. Decreased breath sounds present. No wheezing, rhonchi or rales.  Abdominal:     Palpations: Abdomen is soft.     Tenderness: There is abdominal tenderness ( RUQ). There is no guarding or rebound.  Musculoskeletal: Normal range of motion.  Skin:    General: Skin is warm and dry.  Neurological:     Mental Status: She is alert and oriented to person, place, and time.  Psychiatric:        Behavior: Behavior normal.      UC Treatments / Results  Labs (all labs ordered are listed, but only abnormal results are displayed) Labs Reviewed  POCT CBC W AUTO DIFF ([facility])    EKG Date/Time:02/28/2019  08:51:21 Ventricular Rate: 108 PR Interval: 130 QRS Duration: 60 QT Interval: 320 QTC Calculation: 428 P-R-T axes: 35   27   53 Text Interpretation: Sinus tachycardia, otherwise normal ECG   Radiology No results found.  Procedures Procedures (including critical care time)  Medications Ordered in UC Medications - No data to display  Initial Impression / Assessment and Plan / UC Course  I have reviewed the triage vital signs and the nursing notes.  Pertinent labs & imaging results that were available during my care of the patient were reviewed by me and considered in my medical decision making (see chart for details).     Although chest pain and EKG not concerning for ACS, vitals are abnormal along with CBC- WBC of 21.8 Pt has RUQ abdominal tenderness, however, she had her gallbladder removed several years ago.  DDx: Covid-19, PE, pneumonia, unknown infection? UTI? Encouraged pt to go to emergency department for further evaluation and treatment of  symptoms. Pt stable enough to drive POV to emergency department. [facility]   Final Clinical Impressions(s) / UC Diagnoses   Final diagnoses:  Fever in adult  Other chest pain  Tachycardia  Elevated blood pressure reading     Discharge Instructions      It is recommended that you drive to the emergency department for further testing and treatment of your symptoms.   Your EKG looks reassuring, however, there are other blood tests and possible imaging that may be performed in the emergency department to help determine to source of your symptoms and how to treat them.     ED Prescriptions    None     Controlled Substance Prescriptions [place] Controlled Substance Registry consulted? Not Applicable   Rolla Platehelps, Naren Benally O, PA-C 02/28/19 1019

## 2019-02-28 NOTE — ED Triage Notes (Signed)
Around 4-5 yesterday afternoon had chest pain from shoulder to shoulder.  It was sharp with deep breaths, not as sharp with shallow breaths.  Also had nausea, and 1 dry heave, the nausea then resolved.  Was walking back to her room, and felt dizzy, sat in the floor for about 1 hour then resolved, went back and laid on her bed.  While in the floor, had cold sweats, and hearing felt "foggy".  Once on bed, could not get comfortable. Felt better with standing, but was too weak to stand.  Pain all night and CP this am but not quite as painful.

## 2019-02-28 NOTE — Discharge Instructions (Signed)
°  It is recommended that you drive to the emergency department for further testing and treatment of your symptoms.   Your EKG looks reassuring, however, there are other blood tests and possible imaging that may be performed in the emergency department to help determine to source of your symptoms and how to treat them.

## 2019-03-01 ENCOUNTER — Telehealth: Payer: Self-pay | Admitting: Emergency Medicine

## 2019-03-01 NOTE — Telephone Encounter (Signed)
Patient is better, she has pneumonia is taking ABT.

## 2019-03-04 ENCOUNTER — Other Ambulatory Visit: Payer: Self-pay

## 2019-03-04 ENCOUNTER — Emergency Department
Admission: EM | Admit: 2019-03-04 | Discharge: 2019-03-04 | Disposition: A | Discharge status: Home / Self Care | Payer: BC Managed Care – PPO | Source: Home / Self Care

## 2019-03-04 ENCOUNTER — Telehealth: Payer: Self-pay

## 2019-03-04 DIAGNOSIS — R05 Cough: Secondary | ICD-10-CM

## 2019-03-04 DIAGNOSIS — R059 Cough, unspecified: Secondary | ICD-10-CM

## 2019-03-04 NOTE — Telephone Encounter (Signed)
Spoke with patient, she is feeling better, but not great.  Pt stated that she requested a test at ER, but did not receive one.  Spoke with Leeroy Cha, [facility] and she stated that we could do a test as a nurse visit due to sx.  Notified patient, and she will come in later today.

## 2019-03-04 NOTE — ED Triage Notes (Signed)
Pt was seen here Monday, and sent to ER for COVID test.  Tilden Fossa reviewed medical records, and ordered a covid test.  Pt came in only for COVID test.  Stated she feels better each day.

## 2019-03-06 LAB — SARS-COV-2 RNA,(COVID-19) QUALITATIVE NAAT: SARS CoV2 RNA: NOT DETECTED

## 2019-03-07 ENCOUNTER — Telehealth: Payer: Self-pay | Admitting: *Deleted

## 2019-03-07 NOTE — Telephone Encounter (Signed)
Spoke to pt given COVID results. She has a f/u appt with her PCP today.

## 2019-12-08 ENCOUNTER — Ambulatory Visit: Payer: Self-pay | Attending: Family

## 2019-12-08 DIAGNOSIS — Z23 Encounter for immunization: Secondary | ICD-10-CM

## 2019-12-08 NOTE — Progress Notes (Signed)
   Covid-19 Vaccination Clinic  Name:  KAMBRY TAKACS    MRN: [id number] DOB: 12-20-1965  12/08/2019  Ms. Vannatter was observed post Covid-19 immunization for 15 minutes without incident. She was provided with Vaccine Information Sheet and instruction to access the V-Safe system.   Ms. Dysert was instructed to call 911 with any severe reactions post vaccine: Marland Kitchen Difficulty breathing  . Swelling of face and throat  . A fast heartbeat  . A bad rash all over body  . Dizziness and weakness   Immunizations Administered    Name Date Dose VIS Date Route   Moderna COVID-19 Vaccine 12/08/2019  3:22 PM 0.5 mL 08/09/2019 Intramuscular   Manufacturer: Moderna   Lot: [id number]   NDC: 09927-800-44

## 2020-01-10 ENCOUNTER — Ambulatory Visit: Payer: Self-pay | Attending: Family

## 2020-01-10 DIAGNOSIS — Z23 Encounter for immunization: Secondary | ICD-10-CM

## 2020-01-10 NOTE — Progress Notes (Signed)
   Covid-19 Vaccination Clinic  Name:  BERNISE SYLVAIN    MRN: [id number] DOB: Dec 04, 1965  01/10/2020  Ms. Blas was observed post Covid-19 immunization for 30 minutes based on pre-vaccination screening without incident. She was provided with Vaccine Information Sheet and instruction to access the V-Safe system.   Ms. Portilla was instructed to call 911 with any severe reactions post vaccine: Marland Kitchen Difficulty breathing  . Swelling of face and throat  . A fast heartbeat  . A bad rash all over body  . Dizziness and weakness   Immunizations Administered    Name Date Dose VIS Date Route   Moderna COVID-19 Vaccine 01/10/2020  3:24 PM 0.5 mL 08/2019 Intramuscular   Manufacturer: Gala Murdoch   Lot: [id number]   NDC: 80777-273-99      Covid-19 Vaccination Clinic  Name:  NKENGE SONNTAG    MRN: [id number] DOB: 07-08-1966  01/10/2020  Ms. Aydelott was observed post Covid-19 immunization for 15 minutes without incident. She was provided with Vaccine Information Sheet and instruction to access the V-Safe system.   Ms. Royse was instructed to call 911 with any severe reactions post vaccine: Marland Kitchen Difficulty breathing  . Swelling of face and throat  . A fast heartbeat  . A bad rash all over body  . Dizziness and weakness   Immunizations Administered    Name Date Dose VIS Date Route   Moderna COVID-19 Vaccine 01/10/2020  3:24 PM 0.5 mL 08/2019 Intramuscular   Manufacturer: Moderna   Lot: [id number]   NDC: 06301-601-09
